# Patient Record
Sex: Female | Born: 1975
Health system: Southern US, Community
[De-identification: ages and names within clinical notes are randomized; demographics above are authoritative.]

## PROBLEM LIST (undated history)

## (undated) DIAGNOSIS — T7840XA Allergy, unspecified, initial encounter: Secondary | ICD-10-CM

## (undated) HISTORY — PX: WISDOM TOOTH EXTRACTION: SHX21

## (undated) HISTORY — PX: DILATION AND CURETTAGE OF UTERUS: SHX78

## (undated) HISTORY — PX: LIPOMA EXCISION: SHX5283

## (undated) HISTORY — DX: Allergy, unspecified, initial encounter: T78.40XA

---

## 2016-09-09 ENCOUNTER — Ambulatory Visit (INDEPENDENT_AMBULATORY_CARE_PROVIDER_SITE_OTHER): Payer: 59 | Admitting: Family Medicine

## 2016-09-09 ENCOUNTER — Encounter: Payer: Self-pay | Admitting: Family Medicine

## 2016-09-09 VITALS — BP 128/85 | HR 117 | Temp 101.3°F | Resp 12 | Ht 65.0 in | Wt 131.2 lb

## 2016-09-09 DIAGNOSIS — N911 Secondary amenorrhea: Secondary | ICD-10-CM

## 2016-09-09 DIAGNOSIS — J111 Influenza due to unidentified influenza virus with other respiratory manifestations: Secondary | ICD-10-CM

## 2016-09-09 LAB — POCT INFLUENZA A/B
Influenza A, POC: NEGATIVE
Influenza B, POC: NEGATIVE

## 2016-09-09 MED ORDER — OSELTAMIVIR PHOSPHATE 75 MG PO CAPS: 75.0000 mg | ORAL_CAPSULE | Freq: Two times a day (BID) | ORAL | 0 refills | Status: DC

## 2016-09-09 NOTE — Progress Notes (Signed)
Subjective:  Patient ID: Erika [name], female    DOB: 1975-09-17  Age: 41 y.o. MRN: [id number]  CC: Flu  HPI Erika [name] is a 41 y.o. female presents to the clinic today with concerns for influenza. She is also concerned about recent missed periods.  Patient reports that she's been sick since Sunday. She developed fever yesterday. She is currently febrile. She's had associated congestion, body aches, headache, cough. She's had a known exposure to the flu as her daughter was diagnosed. Her son has influenza as well. No known exacerbating or relieving factors. No other associated symptoms. She is very concerned that she has influenza.  Additionally, patient states that she has not had a period over the past 2 months. This is not normal for her as she is usually quite regular. Her husband has had a vasectomy and she took 2 pregnancy tests which were negative. No other reported symptoms. She would like to discuss this today.  PMH, Surgical Hx, Family Hx, Social History reviewed and updated as below.  No PMH per patient.  Past Surgical History:  Procedure Laterality Date  . DILATION AND CURETTAGE OF UTERUS    . WISDOM TOOTH EXTRACTION      Family History  Problem Relation Age of Onset  . Diabetes Maternal Aunt   . Ovarian cancer Maternal Grandmother   . Prostate cancer Maternal Grandfather     Social History  Substance Use Topics  . Smoking status: Never Smoker  . Smokeless tobacco: Never Used  . Alcohol use 0.0 - 0.6 oz/week   Review of Systems  Constitutional: Positive for fever.  HENT: Positive for congestion.   Respiratory: Positive for cough.   Genitourinary: Positive for menstrual problem.  Musculoskeletal:       Body aches.  Neurological: Positive for headaches.  All other systems reviewed and are negative.  Objective:   Today's Vitals: BP 128/85   Pulse (!) 117   Temp (!) 101.3 F (38.5 C) (Oral)   Resp 12   Ht 5\' 5"  (1.651 m)   Wt 131 lb 3.2 oz (59.5 kg)    SpO2 99%   BMI 21.83 kg/m   Physical Exam  Constitutional: She is oriented to person, place, and time. She appears well-developed. No distress.  HENT:  Head: Normocephalic and atraumatic.  Mouth/Throat: Oropharynx is clear and moist.  Normal TM's bilaterally.  Eyes: Conjunctivae are normal.  Neck: Neck supple.  Cardiovascular: Regular rhythm.   Tachycardic.  Pulmonary/Chest: Effort normal and breath sounds normal.  Abdominal: Soft. She exhibits no distension. There is no tenderness. There is no rebound and no guarding.  Musculoskeletal: Normal range of motion.  Lymphadenopathy:    She has no cervical adenopathy.  Neurological: She is alert and oriented to person, place, and time.  Skin: No rash noted.  Psychiatric: She has a normal mood and affect.  Vitals reviewed.  Assessment & Plan:   Problem List Items Addressed This Visit    Secondary amenorrhea    Amenorrhea 2 months. Negative pregnancy test at home and her husband has had a vasectomy. If persists over the next month will send to GYN for evaluation.      Influenza - Primary    New problem. Patient appears to have influenza despite negative test today. Treating with Tamiflu.      Relevant Medications   oseltamivir (TAMIFLU) 75 MG capsule   Other Relevant Orders   POCT Influenza A/B (Completed)     Outpatient Encounter Prescriptions as of 09/09/2016  Medication Sig  . oseltamivir (TAMIFLU) 75 MG capsule Take 1 capsule (75 mg total) by mouth 2 (two) times daily.   No facility-administered encounter medications on file as of 09/09/2016.     Follow-up: PRN  [place]

## 2016-09-09 NOTE — Assessment & Plan Note (Signed)
Amenorrhea 2 months. Negative pregnancy test at home and her husband has had a vasectomy. If persists over the next month will send to GYN for evaluation.

## 2016-09-09 NOTE — Progress Notes (Signed)
Pre visit review using our clinic review tool, if applicable. No additional management support is needed unless otherwise documented below in the visit note. 

## 2016-09-09 NOTE — Assessment & Plan Note (Signed)
New problem. Patient appears to have influenza despite negative test today. Treating with Tamiflu.

## 2016-09-09 NOTE — Patient Instructions (Signed)
If you don't have a cycle in the next month, I'll send you to GYN.  Tamiflu as prescribed.  Take care  Dr. Lacinda Axon

## 2016-09-29 ENCOUNTER — Ambulatory Visit: Payer: 59 | Admitting: Family Medicine

## 2016-12-08 ENCOUNTER — Encounter: Payer: Self-pay | Admitting: Family Medicine

## 2016-12-08 ENCOUNTER — Ambulatory Visit (INDEPENDENT_AMBULATORY_CARE_PROVIDER_SITE_OTHER): Payer: 59 | Admitting: Family Medicine

## 2016-12-08 DIAGNOSIS — H6121 Impacted cerumen, right ear: Secondary | ICD-10-CM

## 2016-12-08 DIAGNOSIS — H6120 Impacted cerumen, unspecified ear: Secondary | ICD-10-CM | POA: Insufficient documentation

## 2016-12-08 NOTE — Progress Notes (Signed)
Pre visit review using our clinic review tool, if applicable. No additional management support is needed unless otherwise documented below in the visit note. 

## 2016-12-08 NOTE — Assessment & Plan Note (Signed)
New problem. Irrigation performed today with resolution. Advised over-the-counter Debrox as needed to prevent recurrence.

## 2016-12-08 NOTE — Progress Notes (Signed)
   Subjective:  Patient ID: Erika [name], female    DOB: 1976/08/08  Age: 41 y.o. MRN: [id number]  CC: R ear clogged  HPI:  41 year old female presents with the above complaint.  Patient states that her symptoms started Friday. She's had difficulty hearing out of the right ear. It feels clogged. No associated fevers or chills. No pain. No other associated symptoms. No known exacerbating factors. She has used peroxide without resolution. No other complaints this time.  Social Hx   Social History   Social History  . Marital status: Married    Spouse name: N/A  . Number of children: N/A  . Years of education: N/A   Social History Main Topics  . Smoking status: Never Smoker  . Smokeless tobacco: Never Used  . Alcohol use 0.0 - 0.6 oz/week  . Drug use: No  . Sexual activity: Yes    Partners: Male   Other Topics Concern  . None   Social History Narrative  . None    Review of Systems  Constitutional: Negative.   HENT: Positive for hearing loss.    Objective:  BP 104/68   Pulse 72   Temp 98.2 F (36.8 C) (Oral)   Wt 131 lb 8 oz (59.6 kg)   SpO2 98%   BMI 21.88 kg/m   BP/Weight 12/08/2016 09/03/3157  Systolic BP 458 592  Diastolic BP 68 85  Wt. (Lbs) 131.5 131.2  BMI 21.88 21.83   Physical Exam  Constitutional: She is oriented to person, place, and time. She appears well-developed. No distress.  HENT:  Head: Normocephalic and atraumatic.  R TM obscured by Cerumen.  Pulmonary/Chest: Effort normal.  Neurological: She is alert and oriented to person, place, and time.  Psychiatric: She has a normal mood and affect.  Vitals reviewed.  Assessment & Plan:   Problem List Items Addressed This Visit    Cerumen impaction    New problem. Irrigation performed today with resolution. Advised over-the-counter Debrox as needed to prevent recurrence.         Follow-up: PRN  [place]

## 2016-12-10 DIAGNOSIS — Z1231 Encounter for screening mammogram for malignant neoplasm of breast: Secondary | ICD-10-CM | POA: Diagnosis not present

## 2016-12-10 LAB — HM MAMMOGRAPHY

## 2016-12-15 ENCOUNTER — Encounter: Payer: Self-pay | Admitting: Family Medicine

## 2017-01-01 ENCOUNTER — Telehealth: Payer: Self-pay | Admitting: *Deleted

## 2017-01-01 NOTE — Telephone Encounter (Signed)
Patient is currently out of the country with a cold and requested advise on what to take before boarding the plan to come back home. Pt contact [phone]

## 2017-01-01 NOTE — Telephone Encounter (Signed)
Flonase, antihistamine.

## 2017-01-01 NOTE — Telephone Encounter (Signed)
Patient in [place] , has allergies now has cold Sinus drainage and pressure and has nasal congestion , ear pressure with sore throat,  coughing productive cough,  taking sudafed.  Symptoms present for one week.   tried Mucinex D no help, saline spray, . Leaving to come home next Thursday.  Patient has friend that can bring OTC medications to her from [place].   Please advise.

## 2017-01-01 NOTE — Telephone Encounter (Signed)
Left voice mail to call back need more info regarding symptoms.

## 2017-01-01 NOTE — Telephone Encounter (Signed)
Patient advised of below , she states she can't take flonase due it gave her rash.  She asked if she could take claritin along with sudafed.  I advised her per Dr Derrel Nip ok to take together.

## 2017-01-01 NOTE — Telephone Encounter (Signed)
Pt called back returning your call. Thank you!  Call pt @ [phone]

## 2017-03-13 ENCOUNTER — Encounter: Payer: Self-pay | Admitting: Family Medicine

## 2017-03-13 ENCOUNTER — Ambulatory Visit (INDEPENDENT_AMBULATORY_CARE_PROVIDER_SITE_OTHER): Payer: 59 | Admitting: Family Medicine

## 2017-03-13 DIAGNOSIS — J988 Other specified respiratory disorders: Secondary | ICD-10-CM | POA: Diagnosis not present

## 2017-03-13 MED ORDER — DOXYCYCLINE HYCLATE 100 MG PO TABS: 100.0000 mg | ORAL_TABLET | Freq: Two times a day (BID) | ORAL | 0 refills | Status: DC

## 2017-03-13 NOTE — Progress Notes (Signed)
   Subjective:  Patient ID: Erika [name], female    DOB: Mar 19, 1976  Age: 41 y.o. MRN: [id number]  CC: Congestion, cough  HPI:  41 year old female presents with the above complaints.  Patient reports that she's been sick since May. It worsened when she went out of the country. She traveled to [place]. She actually saw a doctor while she was there and received some steroids. She reports that she had improvement. She subsequently had worsening of her symptoms again approximately 2 weeks ago. She's had severe congestion and cough. Cough is mildly productive. No fevers or chills. No shortness of breath. She's had no relief with Claritin. No other associated symptoms. No other complaints or concerns at this time.  Social Hx   Social History   Social History  . Marital status: Married    Spouse name: N/A  . Number of children: N/A  . Years of education: N/A   Social History Main Topics  . Smoking status: Never Smoker  . Smokeless tobacco: Never Used  . Alcohol use 0.0 - 0.6 oz/week  . Drug use: No  . Sexual activity: Yes    Partners: Male   Other Topics Concern  . None   Social History Narrative  . None    Review of Systems  Constitutional: Negative for fever.  HENT: Positive for congestion.   Respiratory: Positive for cough. Negative for shortness of breath.    Objective:  BP 106/80   Pulse 75   Temp 98.2 F (36.8 C) (Oral)   Wt 132 lb 6.4 oz (60.1 kg)   LMP 03/06/2017 (Approximate)   SpO2 99%   BMI 22.03 kg/m   BP/Weight 03/13/2017 12/08/2016 10/10/2444  Systolic BP 950 722 575  Diastolic BP 80 68 85  Wt. (Lbs) 132.4 131.5 131.2  BMI 22.03 21.88 21.83   Physical Exam  Constitutional: She is oriented to person, place, and time. She appears well-developed. No distress.  HENT:  Head: Normocephalic and atraumatic.  Oropharynx clear. Normal TMs bilaterally.  Eyes: Conjunctivae are normal. No scleral icterus.  Neck: Neck supple.  Cardiovascular: Normal rate and  regular rhythm.   No murmur heard. Pulmonary/Chest: Effort normal and breath sounds normal. She has no wheezes.  Lymphadenopathy:    She has cervical adenopathy.  Neurological: She is alert and oriented to person, place, and time.  Vitals reviewed.  Assessment & Plan:   Problem List Items Addressed This Visit      Respiratory   Respiratory infection    New problem. Given duration of illness and worsening symptoms, I am treating her with doxycycline.         Meds ordered this encounter  Medications  . doxycycline (VIBRA-TABS) 100 MG tablet    Sig: Take 1 tablet (100 mg total) by mouth 2 (two) times daily.    Dispense:  20 tablet    Refill:  0   Follow-up: PRN  [place]

## 2017-03-13 NOTE — Assessment & Plan Note (Signed)
New problem. Given duration of illness and worsening symptoms, I am treating her with doxycycline.

## 2017-03-13 NOTE — Patient Instructions (Signed)
Antibiotic as prescribed. Take with food. Be mindful of photosensitivity.  Continue Claritin.  Take care  Dr. Lacinda Axon

## 2017-04-10 ENCOUNTER — Telehealth: Payer: Self-pay | Admitting: Family Medicine

## 2017-04-10 ENCOUNTER — Other Ambulatory Visit: Payer: Self-pay | Admitting: Family Medicine

## 2017-04-10 MED ORDER — FLUCONAZOLE 150 MG PO TABS: ORAL_TABLET | ORAL | 3 refills | Status: DC

## 2017-04-10 NOTE — Telephone Encounter (Signed)
Reason for call: yeast infection Symptoms: itching  Vaginal discharge  Duration 3-4 days  Medications:no OTC, tried Monistat cream , previously on Doxycyline  Last seen for this problem: 03/13/17 just finished antibiotic last Saturday [place] to at beach  Seen by: Lacinda Axon

## 2017-04-10 NOTE — Telephone Encounter (Signed)
Pt called about possible having a yeast infection. Please advise?  Call pt @ [phone]. Thank you!

## 2017-04-10 NOTE — Telephone Encounter (Signed)
Rx sent 

## 2017-04-10 NOTE — Telephone Encounter (Signed)
Left voicemail advising script sent   

## 2017-07-13 ENCOUNTER — Ambulatory Visit (INDEPENDENT_AMBULATORY_CARE_PROVIDER_SITE_OTHER): Payer: 59

## 2017-07-13 DIAGNOSIS — Z23 Encounter for immunization: Secondary | ICD-10-CM

## 2017-11-13 ENCOUNTER — Telehealth: Payer: Self-pay

## 2017-11-13 NOTE — Telephone Encounter (Signed)
Copied from [place]. Topic: General - Call Back - No Documentation >> Nov 13, 2017 10:51 AM Arletha Grippe wrote: Reason for CRM: pt returning call - no crm  Cb is [phone]

## 2017-12-22 ENCOUNTER — Ambulatory Visit: Payer: 59 | Admitting: Internal Medicine

## 2017-12-22 ENCOUNTER — Other Ambulatory Visit (HOSPITAL_COMMUNITY)
Admission: RE | Admit: 2017-12-22 | Discharge: 2017-12-22 | Disposition: A | Discharge status: Home / Self Care | Payer: 59 | Source: Ambulatory Visit | Attending: Internal Medicine | Admitting: Internal Medicine

## 2017-12-22 ENCOUNTER — Encounter: Payer: Self-pay | Admitting: Internal Medicine

## 2017-12-22 VITALS — BP 102/64 | HR 95 | Temp 98.6°F | Ht 65.0 in | Wt 135.2 lb

## 2017-12-22 DIAGNOSIS — Z1389 Encounter for screening for other disorder: Secondary | ICD-10-CM

## 2017-12-22 DIAGNOSIS — Z1231 Encounter for screening mammogram for malignant neoplasm of breast: Secondary | ICD-10-CM

## 2017-12-22 DIAGNOSIS — Z124 Encounter for screening for malignant neoplasm of cervix: Secondary | ICD-10-CM | POA: Insufficient documentation

## 2017-12-22 DIAGNOSIS — Z1329 Encounter for screening for other suspected endocrine disorder: Secondary | ICD-10-CM | POA: Diagnosis not present

## 2017-12-22 DIAGNOSIS — Z0184 Encounter for antibody response examination: Secondary | ICD-10-CM | POA: Diagnosis not present

## 2017-12-22 DIAGNOSIS — Z1151 Encounter for screening for human papillomavirus (HPV): Secondary | ICD-10-CM | POA: Diagnosis not present

## 2017-12-22 DIAGNOSIS — Z1283 Encounter for screening for malignant neoplasm of skin: Secondary | ICD-10-CM | POA: Diagnosis not present

## 2017-12-22 DIAGNOSIS — N912 Amenorrhea, unspecified: Secondary | ICD-10-CM

## 2017-12-22 DIAGNOSIS — N926 Irregular menstruation, unspecified: Secondary | ICD-10-CM

## 2017-12-22 DIAGNOSIS — E559 Vitamin D deficiency, unspecified: Secondary | ICD-10-CM

## 2017-12-22 DIAGNOSIS — Z13220 Encounter for screening for lipoid disorders: Secondary | ICD-10-CM

## 2017-12-22 DIAGNOSIS — Z0000 Encounter for general adult medical examination without abnormal findings: Secondary | ICD-10-CM | POA: Diagnosis not present

## 2017-12-22 DIAGNOSIS — L509 Urticaria, unspecified: Secondary | ICD-10-CM

## 2017-12-22 DIAGNOSIS — Z1159 Encounter for screening for other viral diseases: Secondary | ICD-10-CM

## 2017-12-22 NOTE — Patient Instructions (Addendum)
Propylene glycol testing with dermatology or patch testing  Please schedule mammogram  Please schedule fasting labs  I referred you to dermatology Dr. Kellie Moor     Hives Hives (urticaria) are itchy, red, swollen areas on your skin. Hives can show up on any part of your body, and they can vary in size. They can be as small as the tip of a pen or much larger. Hives often fade within 24 hours (acute hives). In other cases, new hives show up after old ones fade. This can continue for many days or weeks (chronic hives). Hives are caused by your body's reaction to an irritant or to something that you are allergic to (trigger). You can get hives right after being around a trigger or hours later. Hives do not spread from person to person (are not contagious). Hives may get worse if you scratch them, if you exercise, or if you have worries (emotional stress). Follow these instructions at home: Medicines  Take or apply over-the-counter and prescription medicines only as told by your doctor.  If you were prescribed an antibiotic medicine, use it as told by your doctor. Do not stop taking the antibiotic even if you start to feel better. Skin Care  Apply cool, wet cloths (cool compresses) to the itchy, red, swollen areas.  Do not scratch your skin. Do not rub your skin. General instructions  Do not take hot showers or baths. This can make itching worse.  Do not wear tight clothes.  Use sunscreen and wear clothing that covers your skin when you are outside.  Avoid any triggers that cause your hives. Keep a journal to help you keep track of what causes your hives. Write down: ? What medicines you take. ? What you eat and drink. ? What products you use on your skin.  Keep all follow-up visits as told by your doctor. This is important. Contact a doctor if:  Your symptoms are not better with medicine.  Your joints are painful or swollen. Get help right away if:  You have a fever.  You have  belly pain.  Your tongue or lips are swollen.  Your eyelids are swollen.  Your chest or throat feels tight.  You have trouble breathing or swallowing. These symptoms may be an emergency. Do not wait to see if the symptoms will go away. Get medical help right away. Call your local emergency services (911 in the [place] Do not drive yourself to the hospital. This information is not intended to replace advice given to you by your health care provider. Make sure you discuss any questions you have with your health care provider. Document Released: 05/20/2008 Document Revised: 01/17/2016 Document Reviewed: 05/30/2015 Elsevier Interactive Patient Education  2018 [employer].

## 2017-12-22 NOTE — Progress Notes (Addendum)
Chief Complaint  Patient presents with  . Annual Exam   Annual exam  1. Hives with certain products including flonase regular formulations and sensimist formulation and also with certain cosmetic products. Will refer dermatology  2. Irregular cycles this time it has been 6 weeks. She is not pregnant b/c husband had a vasectomy. Last cycle 6 weeks ago and not had yet   Review of Systems  Constitutional: Negative for weight loss.  HENT: Negative for hearing loss.   Eyes: Negative for blurred vision.  Respiratory: Negative for shortness of breath.   Cardiovascular: Negative for chest pain.  Gastrointestinal: Negative for abdominal pain.  Genitourinary:       +irregular cycles   Musculoskeletal: Negative for joint pain.  Skin: Negative for rash.  Neurological: Negative for headaches.  Psychiatric/Behavioral: Negative for depression.   History reviewed. No pertinent past medical history. Past Surgical History:  Procedure Laterality Date  . DILATION AND CURETTAGE OF UTERUS    . WISDOM TOOTH EXTRACTION     Family History  Problem Relation Age of Onset  . Diabetes Maternal Aunt   . Ovarian cancer Maternal Grandmother   . Prostate cancer Maternal Grandfather    Social History   Socioeconomic History  . Marital status: Married    Spouse name: Not on file  . Number of children: Not on file  . Years of education: Not on file  . Highest education level: Not on file  Occupational History  . Not on file  Social Needs  . Financial resource strain: Not on file  . Food insecurity:    Worry: Not on file    Inability: Not on file  . Transportation needs:    Medical: Not on file    Non-medical: Not on file  Tobacco Use  . Smoking status: Never Smoker  . Smokeless tobacco: Never Used  Substance and Sexual Activity  . Alcohol use: Yes    Alcohol/week: 0.0 - 0.6 oz  . Drug use: No  . Sexual activity: Yes    Partners: Male  Lifestyle  . Physical activity:    Days per week: Not  on file    Minutes per session: Not on file  . Stress: Not on file  Relationships  . Social connections:    Talks on phone: Not on file    Gets together: Not on file    Attends religious service: Not on file    Active member of club or organization: Not on file    Attends meetings of clubs or organizations: Not on file    Relationship status: Not on file  . Intimate partner violence:    Fear of current or ex partner: Not on file    Emotionally abused: Not on file    Physically abused: Not on file    Forced sexual activity: Not on file  Other Topics Concern  . Not on file  Social History Narrative  . Not on file   No outpatient medications have been marked as taking for the 12/22/17 encounter (Office Visit) with McLean-Scocuzza, Nino Glow, MD.   Allergies  Allergen Reactions  . Flonase [Fluticasone] Rash  . Penicillins Other (See Comments)   No results found for this or any previous visit (from the past 2160 hour(s)). Objective  Body mass index is 22.5 kg/m. Wt Readings from Last 3 Encounters:  12/22/17 135 lb 3.2 oz (61.3 kg)  03/13/17 132 lb 6.4 oz (60.1 kg)  12/08/16 131 lb 8 oz (59.6 kg)  Temp Readings from Last 3 Encounters:  12/22/17 98.6 F (37 C) (Oral)  03/13/17 98.2 F (36.8 C) (Oral)  12/08/16 98.2 F (36.8 C) (Oral)   BP Readings from Last 3 Encounters:  12/22/17 102/64  03/13/17 106/80  12/08/16 104/68   Pulse Readings from Last 3 Encounters:  12/22/17 95  03/13/17 75  12/08/16 72    Physical Exam  Constitutional: She is oriented to person, place, and time. Vital signs are normal. She appears well-developed and well-nourished. She is cooperative.  HENT:  Head: Normocephalic and atraumatic.  Mouth/Throat: Oropharynx is clear and moist and mucous membranes are normal.  Eyes: Pupils are equal, round, and reactive to light. Conjunctivae are normal.  Cardiovascular: Normal rate, regular rhythm and normal heart sounds.  Pulmonary/Chest: Effort normal  and breath sounds normal. Right breast exhibits no inverted nipple, no mass, no nipple discharge, no skin change and no tenderness. Left breast exhibits no inverted nipple, no mass, no nipple discharge, no skin change and no tenderness. No breast swelling, tenderness, discharge or bleeding. Breasts are symmetrical.  Abdominal: Soft. Bowel sounds are normal.  Genitourinary: Vagina normal and uterus normal. Pelvic exam was performed with patient supine. There is no lesion on the right labia. There is no lesion on the left labia. Cervix exhibits discharge and friability. Cervix exhibits no motion tenderness. Right adnexum displays no mass, no tenderness and no fullness. Left adnexum displays no mass, no tenderness and no fullness.  Neurological: She is alert and oriented to person, place, and time. Gait normal.  Skin: Skin is warm, dry and intact.  Psychiatric: She has a normal mood and affect. Her speech is normal and behavior is normal. Judgment and thought content normal. Cognition and memory are normal.  Nursing note and vitals reviewed. mild thin clear discharge from cervix   Assessment   1. Annual exam  2. Irregular cycles ? If perimenopausal  Plan  1. Breast exam and pap today  Referred for mammogram [facility] done 02/04/18 negative  Referred dermatology skin check tbse and patch test propylene glycol vs other Had flu shot  Tdap had 10/11/10   Continue exercise and healthy diet choices  Annual labs pt to return fasting   2.  Check [facility]  Provider: Dr. Olivia Mackie McLean-Scocuzza-Internal Medicine

## 2017-12-22 NOTE — Progress Notes (Signed)
Pre visit review using our clinic review tool, if applicable. No additional management support is needed unless otherwise documented below in the visit note. 

## 2017-12-23 LAB — URINALYSIS, ROUTINE W REFLEX MICROSCOPIC
Bacteria, UA: NONE SEEN /HPF
Bilirubin Urine: NEGATIVE
Glucose, UA: NEGATIVE
HGB URINE DIPSTICK: NEGATIVE
Hyaline Cast: NONE SEEN /LPF
Nitrite: NEGATIVE
PH: 7 (ref 5.0–8.0)
Protein, ur: NEGATIVE
RBC / HPF: NONE SEEN /HPF (ref 0–2)
Specific Gravity, Urine: 1.008 (ref 1.001–1.03)
Squamous Epithelial / LPF: NONE SEEN /HPF (ref <=5)

## 2017-12-25 LAB — CYTOLOGY - PAP
Bacterial vaginitis: NEGATIVE
CANDIDA VAGINITIS: NEGATIVE
Diagnosis: NEGATIVE
HPV (WINDOPATH): NOT DETECTED

## 2018-01-01 ENCOUNTER — Other Ambulatory Visit (INDEPENDENT_AMBULATORY_CARE_PROVIDER_SITE_OTHER): Payer: 59

## 2018-01-01 DIAGNOSIS — N926 Irregular menstruation, unspecified: Secondary | ICD-10-CM | POA: Diagnosis not present

## 2018-01-01 DIAGNOSIS — E559 Vitamin D deficiency, unspecified: Secondary | ICD-10-CM | POA: Diagnosis not present

## 2018-01-01 DIAGNOSIS — Z13220 Encounter for screening for lipoid disorders: Secondary | ICD-10-CM

## 2018-01-01 DIAGNOSIS — Z1329 Encounter for screening for other suspected endocrine disorder: Secondary | ICD-10-CM

## 2018-01-01 DIAGNOSIS — Z0184 Encounter for antibody response examination: Secondary | ICD-10-CM

## 2018-01-01 DIAGNOSIS — Z0000 Encounter for general adult medical examination without abnormal findings: Secondary | ICD-10-CM

## 2018-01-01 DIAGNOSIS — N912 Amenorrhea, unspecified: Secondary | ICD-10-CM | POA: Diagnosis not present

## 2018-01-01 DIAGNOSIS — Z1159 Encounter for screening for other viral diseases: Secondary | ICD-10-CM

## 2018-01-01 NOTE — Addendum Note (Signed)
Addended by: Arby Barrette on: 01/01/2018 09:39 AM   Modules accepted: Orders

## 2018-01-02 LAB — COMPREHENSIVE METABOLIC PANEL
AG Ratio: 1.6 (calc) (ref 1.0–2.5)
ALKALINE PHOSPHATASE (APISO): 56 U/L (ref 33–115)
ALT: 16 U/L (ref 6–29)
AST: 22 U/L (ref 10–30)
Albumin: 4.2 g/dL (ref 3.6–5.1)
BILIRUBIN TOTAL: 0.7 mg/dL (ref 0.2–1.2)
BUN: 14 mg/dL (ref 7–25)
CALCIUM: 9.2 mg/dL (ref 8.6–10.2)
CO2: 24 mmol/L (ref 20–32)
Chloride: 105 mmol/L (ref 98–110)
Creat: 0.75 mg/dL (ref 0.50–1.10)
Globulin: 2.7 g/dL (calc) (ref 1.9–3.7)
Glucose, Bld: 77 mg/dL (ref 65–99)
Potassium: 4.7 mmol/L (ref 3.5–5.3)
SODIUM: 139 mmol/L (ref 135–146)
TOTAL PROTEIN: 6.9 g/dL (ref 6.1–8.1)

## 2018-01-02 LAB — FOLLICLE STIMULATING HORMONE: FSH: 32.2 m[IU]/mL

## 2018-01-02 LAB — LIPID PANEL
CHOLESTEROL: 172 mg/dL (ref <200)
HDL: 61 mg/dL (ref >50)
LDL CHOLESTEROL (CALC): 93 mg/dL
Non-HDL Cholesterol (Calc): 111 mg/dL (calc) (ref <130)
TRIGLYCERIDES: 88 mg/dL (ref <150)
Total CHOL/HDL Ratio: 2.8 (calc) (ref <5.0)

## 2018-01-02 LAB — CBC WITH DIFFERENTIAL/PLATELET
BASOS PCT: 0.8 %
Basophils Absolute: 50 cells/uL (ref 0–200)
EOS PCT: 1.7 %
Eosinophils Absolute: 107 cells/uL (ref 15–500)
HEMATOCRIT: 38 % (ref 35.0–45.0)
HEMOGLOBIN: 12.6 g/dL (ref 11.7–15.5)
LYMPHS ABS: 2136 {cells}/uL (ref 850–3900)
MCH: 28.6 pg (ref 27.0–33.0)
MCHC: 33.2 g/dL (ref 32.0–36.0)
MCV: 86.2 fL (ref 80.0–100.0)
MPV: 10.8 fL (ref 7.5–12.5)
Monocytes Relative: 9.5 %
Neutro Abs: 3408 cells/uL (ref 1500–7800)
Neutrophils Relative %: 54.1 %
Platelets: 342 10*3/uL (ref 140–400)
RBC: 4.41 10*6/uL (ref 3.80–5.10)
RDW: 13.9 % (ref 11.0–15.0)
Total Lymphocyte: 33.9 %
WBC mixed population: 599 cells/uL (ref 200–950)
WBC: 6.3 10*3/uL (ref 3.8–10.8)

## 2018-01-02 LAB — VITAMIN D 25 HYDROXY (VIT D DEFICIENCY, FRACTURES): Vit D, 25-Hydroxy: 30 ng/mL (ref 30–100)

## 2018-01-02 LAB — T4, FREE: FREE T4: 1 ng/dL (ref 0.8–1.8)

## 2018-01-02 LAB — HEPATITIS B SURFACE ANTIBODY, QUANTITATIVE: Hepatitis B-Post: 757 m[IU]/mL (ref >=10)

## 2018-01-02 LAB — TSH: TSH: 1.34 mIU/L

## 2018-02-04 DIAGNOSIS — Z1231 Encounter for screening mammogram for malignant neoplasm of breast: Secondary | ICD-10-CM | POA: Diagnosis not present

## 2018-02-04 LAB — HM MAMMOGRAPHY

## 2018-02-21 ENCOUNTER — Encounter: Payer: Self-pay | Admitting: Internal Medicine

## 2018-04-08 DIAGNOSIS — D225 Melanocytic nevi of trunk: Secondary | ICD-10-CM | POA: Diagnosis not present

## 2018-04-08 DIAGNOSIS — L7211 Pilar cyst: Secondary | ICD-10-CM | POA: Diagnosis not present

## 2018-04-08 DIAGNOSIS — D2261 Melanocytic nevi of right upper limb, including shoulder: Secondary | ICD-10-CM | POA: Diagnosis not present

## 2018-06-06 DIAGNOSIS — Z23 Encounter for immunization: Secondary | ICD-10-CM | POA: Diagnosis not present

## 2018-06-24 ENCOUNTER — Encounter: Payer: Self-pay | Admitting: Internal Medicine

## 2018-06-24 ENCOUNTER — Ambulatory Visit: Payer: 59 | Admitting: Internal Medicine

## 2018-06-24 VITALS — BP 90/56 | HR 85 | Temp 98.3°F | Ht 65.0 in | Wt 136.0 lb

## 2018-06-24 DIAGNOSIS — Z780 Asymptomatic menopausal state: Secondary | ICD-10-CM | POA: Diagnosis not present

## 2018-06-24 DIAGNOSIS — N951 Menopausal and female climacteric states: Secondary | ICD-10-CM

## 2018-06-24 DIAGNOSIS — L739 Follicular disorder, unspecified: Secondary | ICD-10-CM

## 2018-06-24 DIAGNOSIS — G4700 Insomnia, unspecified: Secondary | ICD-10-CM | POA: Diagnosis not present

## 2018-06-24 MED ORDER — VENLAFAXINE HCL ER 37.5 MG PO CP24: 37.5000 mg | ORAL_CAPSULE | Freq: Every day | ORAL | 2 refills | Status: DC

## 2018-06-24 NOTE — Progress Notes (Signed)
Chief Complaint  Patient presents with  . Follow-up   F/u  1. C/o irregular menses last may then oct, mood swings more angry easily. She denies hot flashes but is having insomnia with weight fluctuations denies vaginal dryness. She is in counseling with husband for libido issues which have been ongoing prior to theses changes [place] was >30. Her mother experienced menopause early in 28s  2. C/o inner thighs she is shaving at times gets hair bumps appear ingrown and wants to know what to do   Review of Systems  Constitutional: Negative for weight loss.  HENT: Negative for hearing loss.   Eyes: Negative for blurred vision.  Respiratory: Negative for shortness of breath.   Cardiovascular: Negative for chest pain.  Genitourinary:       +irregular menses   Psychiatric/Behavioral: The patient has insomnia.        +mood swings    Past Medical History:  Diagnosis Date  . Allergy    Past Surgical History:  Procedure Laterality Date  . DILATION AND CURETTAGE OF UTERUS    . WISDOM TOOTH EXTRACTION     Family History  Problem Relation Age of Onset  . Diabetes Maternal Aunt   . Ovarian cancer Maternal Grandmother   . Prostate cancer Maternal Grandfather    Social History   Socioeconomic History  . Marital status: Married    Spouse name: Not on file  . Number of children: Not on file  . Years of education: Not on file  . Highest education level: Not on file  Occupational History  . Not on file  Social Needs  . Financial resource strain: Not on file  . Food insecurity:    Worry: Not on file    Inability: Not on file  . Transportation needs:    Medical: Not on file    Non-medical: Not on file  Tobacco Use  . Smoking status: Never Smoker  . Smokeless tobacco: Never Used  Substance and Sexual Activity  . Alcohol use: Yes    Alcohol/week: 0.0 - 1.0 standard drinks  . Drug use: No  . Sexual activity: Yes    Partners: Male  Lifestyle  . Physical activity:    Days per week: Not  on file    Minutes per session: Not on file  . Stress: Not on file  Relationships  . Social connections:    Talks on phone: Not on file    Gets together: Not on file    Attends religious service: Not on file    Active member of club or organization: Not on file    Attends meetings of clubs or organizations: Not on file    Relationship status: Not on file  . Intimate partner violence:    Fear of current or ex partner: Not on file    Emotionally abused: Not on file    Physically abused: Not on file    Forced sexual activity: Not on file  Other Topics Concern  . Not on file  Social History Narrative   Works Ship broker in [place]    Married 2 kids boy 78 y.o and girl 96 y.o 12/22/17    No outpatient medications have been marked as taking for the 06/24/18 encounter (Office Visit) with McLean-Scocuzza, Nino Glow, MD.   Allergies  Allergen Reactions  . Flonase [Fluticasone] Rash  . Penicillins Other (See Comments)   No results found for this or any previous visit (from the past 2160 hour(s)). Objective  Body mass  index is 22.63 kg/m. Wt Readings from Last 3 Encounters:  06/24/18 136 lb (61.7 kg)  12/22/17 135 lb 3.2 oz (61.3 kg)  03/13/17 132 lb 6.4 oz (60.1 kg)   Temp Readings from Last 3 Encounters:  06/24/18 98.3 F (36.8 C) (Oral)  12/22/17 98.6 F (37 C) (Oral)  03/13/17 98.2 F (36.8 C) (Oral)   BP Readings from Last 3 Encounters:  06/24/18 (!) 90/56  12/22/17 102/64  03/13/17 106/80   Pulse Readings from Last 3 Encounters:  06/24/18 85  12/22/17 95  03/13/17 75    Physical Exam  Constitutional: She is oriented to person, place, and time. Vital signs are normal. She appears well-developed and well-nourished. She is cooperative.  HENT:  Head: Normocephalic and atraumatic.  Mouth/Throat: Oropharynx is clear and moist and mucous membranes are normal.  Eyes: Pupils are equal, round, and reactive to light. Conjunctivae are normal.  Cardiovascular: Normal rate,  regular rhythm and normal heart sounds.  Pulmonary/Chest: Effort normal and breath sounds normal.  Neurological: She is alert and oriented to person, place, and time. Gait normal.  Skin: Skin is warm, dry and intact.  Psychiatric: She has a normal mood and affect. Her speech is normal and behavior is normal. Judgment and thought content normal. Cognition and memory are normal.  Nursing note and vitals reviewed.   Assessment   1. Likely perimenopause with mood irritability and insomnia see HPI 2. HM 3. Folliculitis  Plan   1.  Trial of effexor 37.5 mg qd  Disc melatonin or L theanine  My chart in 1 month to see how doing 2.  Pap 12/22/17 neg pap neg HPV  02/04/18 mammogram [facility] [place] done 02/04/18 negative  Referred dermatology skin check tbse and patch test propylene glycol vs other saw 2019 negative  Had flu shot 10/13/1 9 Tdap had 10/11/10   Continue exercise and healthy diet choices  3.  Disc otc cetaphil antibacterial soap if this does not work try clindamycin topical  Disc Copy   Provider: Dr. Olivia Mackie McLean-Scocuzza-[facility]

## 2018-06-24 NOTE — Progress Notes (Signed)
Pre visit review using our clinic review tool, if applicable. No additional management support is needed unless otherwise documented below in the visit note. 

## 2018-06-24 NOTE — Patient Instructions (Addendum)
Melatonin or L theanine -[employer] or [employer]  Diffuser with lavender  My chart in 1 month and let me know you are doing   cetaphil antibacterial soap  Electric razor    Folliculitis Folliculitis is inflammation of the hair follicles. Folliculitis most commonly occurs on the scalp, thighs, legs, back, and buttocks. However, it can occur anywhere on the body. What are the causes? This condition may be caused by:  A bacterial infection (common).  A fungal infection.  A viral infection.  Coming into contact with certain chemicals, especially oils and tars.  Shaving or waxing.  Applying greasy ointments or creams to your skin often.  Long-lasting folliculitis and folliculitis that keeps coming back can be caused by bacteria that live in the nostrils. What increases the risk? This condition is more likely to develop in people with:  A weakened immune system.  Diabetes.  Obesity.  What are the signs or symptoms? Symptoms of this condition include:  Redness.  Soreness.  Swelling.  Itching.  Small white or yellow, pus-filled, itchy spots (pustules) that appear over a reddened area. If there is an infection that goes deep into the follicle, these may develop into a boil (furuncle).  A group of closely packed boils (carbuncle). These tend to form in hairy, sweaty areas of the body.  How is this diagnosed? This condition is diagnosed with a skin exam. To find what is causing the condition, your health care provider may take a sample of one of the pustules or boils for testing. How is this treated? This condition may be treated by:  Applying warm compresses to the affected areas.  Taking an antibiotic medicine or applying an antibiotic medicine to the skin.  Applying or bathing with an antiseptic solution.  Taking an over-the-counter medicine to help with itching.  Having a procedure to drain any pustules or boils. This may be  done if a pustule or boil contains a lot of pus or fluid.  Laser hair removal. This may be done to treat long-lasting folliculitis.  Follow these instructions at home:  If directed, apply heat to the affected area as often as told by your health care provider. Use the heat source that your health care provider recommends, such as a moist heat pack or a heating pad. ? Place a towel between your skin and the heat source. ? Leave the heat on for 20-30 minutes. ? Remove the heat if your skin turns bright red. This is especially important if you are unable to feel pain, heat, or cold. You may have a greater risk of getting burned.  If you were prescribed an antibiotic medicine, use it as told by your health care provider. Do not stop using the antibiotic even if you start to feel better.  Take over-the-counter and prescription medicines only as told by your health care provider.  Do not shave irritated skin.  Keep all follow-up visits as told by your health care provider. This is important. Get help right away if:  You have more redness, swelling, or pain in the affected area.  Red streaks are spreading from the affected area.  You have a fever. This information is not intended to replace advice given to you by your health care provider. Make sure you discuss any questions you have with your health care provider. Document Released: 10/20/2001 Document Revised: 02/29/2016 Document Reviewed: 06/01/2015 Elsevier Interactive Patient Education  2018 [employer].  Menopause Menopause is the normal time  of life when menstrual periods stop completely. Menopause is complete when you have missed 12 consecutive menstrual periods. It usually occurs between the ages of 101 years and 22 years. Very rarely does a woman develop menopause before the age of 17 years. At menopause, your ovaries stop producing the female hormones estrogen and progesterone. This can cause undesirable symptoms and also affect  your health. Sometimes the symptoms may occur 4-5 years before the menopause begins. There is no relationship between menopause and:  Oral contraceptives.  Number of children you had.  Race.  The age your menstrual periods started (menarche).  Heavy smokers and very thin women may develop menopause earlier in life. What are the causes?  The ovaries stop producing the female hormones estrogen and progesterone. Other causes include:  Surgery to remove both ovaries.  The ovaries stop functioning for no known reason.  Tumors of the pituitary gland in the brain.  Medical disease that affects the ovaries and hormone production.  Radiation treatment to the abdomen or pelvis.  Chemotherapy that affects the ovaries.  What are the signs or symptoms?  Hot flashes.  Night sweats.  Decrease in sex drive.  Vaginal dryness and thinning of the vagina causing painful intercourse.  Dryness of the skin and developing wrinkles.  Headaches.  Tiredness.  Irritability.  Memory problems.  Weight gain.  Bladder infections.  Hair growth of the face and chest.  Infertility. More serious symptoms include:  Loss of bone (osteoporosis) causing breaks (fractures).  Depression.  Hardening and narrowing of the arteries (atherosclerosis) causing heart attacks and strokes.  How is this diagnosed?  When the menstrual periods have stopped for 12 straight months.  Physical exam.  Hormone studies of the blood. How is this treated? There are many treatment choices and nearly as many questions about them. The decisions to treat or not to treat menopausal changes is an individual choice made with your health care provider. Your health care provider can discuss the treatments with you. Together, you can decide which treatment will work best for you. Your treatment choices may include:  Hormone therapy (estrogen and progesterone).  Non-hormonal medicines.  Treating the individual  symptoms with medicine (for example antidepressants for depression).  Herbal medicines that may help specific symptoms.  Counseling by a psychiatrist or psychologist.  Group therapy.  Lifestyle changes including: ? Eating healthy. ? Regular exercise. ? Limiting caffeine and alcohol. ? Stress management and meditation.  No treatment.  Follow these instructions at home:  Take the medicine your health care provider gives you as directed.  Get plenty of sleep and rest.  Exercise regularly.  Eat a diet that contains calcium (good for the bones) and soy products (acts like estrogen hormone).  Avoid alcoholic beverages.  Do not smoke.  If you have hot flashes, dress in layers.  Take supplements, calcium, and vitamin D to strengthen bones.  You can use over-the-counter lubricants or moisturizers for vaginal dryness.  Group therapy is sometimes very helpful.  Acupuncture may be helpful in some cases. Contact a health care provider if:  You are not sure you are in menopause.  You are having menopausal symptoms and need advice and treatment.  You are still having menstrual periods after age 13 years.  You have pain with intercourse.  Menopause is complete (no menstrual period for 12 months) and you develop vaginal bleeding.  You need a referral to a specialist (gynecologist, psychiatrist, or psychologist) for treatment. Get help right away if:  You have  severe depression.  You have excessive vaginal bleeding.  You fell and think you have a broken bone.  You have pain when you urinate.  You develop leg or chest pain.  You have a fast pounding heart beat (palpitations).  You have severe headaches.  You develop vision problems.  You feel a lump in your breast.  You have abdominal pain or severe indigestion. This information is not intended to replace advice given to you by your health care provider. Make sure you discuss any questions you have with your  health care provider. Document Released: 11/01/2003 Document Revised: 01/17/2016 Document Reviewed: 03/10/2013 Elsevier Interactive Patient Education  2017 [employer].   Venlafaxine extended-release capsules What is this medicine? VENLAFAXINE(VEN la fax een) is used to treat depression, anxiety and panic disorder. This medicine may be used for other purposes; ask your health care provider or pharmacist if you have questions. COMMON BRAND NAME(S): Effexor XR What should I tell my health care provider before I take this medicine? They need to know if you have any of these conditions: -bleeding disorders -glaucoma -heart disease -high blood pressure -high cholesterol -kidney disease -liver disease -low levels of sodium in the blood -mania or bipolar disorder -seizures -suicidal thoughts, plans, or attempt; a previous suicide attempt by you or a family -take medicines that treat or prevent blood clots -thyroid disease -an unusual or allergic reaction to venlafaxine, desvenlafaxine, other medicines, foods, dyes, or preservatives -pregnant or trying to get pregnant -breast-feeding How should I use this medicine? Take this medicine by mouth with a full glass of water. Follow the directions on the prescription label. Do not cut, crush, or chew this medicine. Take it with food. If needed, the capsule may be carefully opened and the entire contents sprinkled on a spoonful of cool applesauce. Swallow the applesauce/pellet mixture right away without chewing and follow with a glass of water to ensure complete swallowing of the pellets. Try to take your medicine at about the same time each day. Do not take your medicine more often than directed. Do not stop taking this medicine suddenly except upon the advice of your doctor. Stopping this medicine too quickly may cause serious side effects or your condition may worsen. A special MedGuide will be given to you by the pharmacist with each  prescription and refill. Be sure to read this information carefully each time. Talk to your pediatrician regarding the use of this medicine in children. Special care may be needed. Overdosage: If you think you have taken too much of this medicine contact a poison control center or emergency room at once. NOTE: This medicine is only for you. Do not share this medicine with others. What if I miss a dose? If you miss a dose, take it as soon as you can. If it is almost time for your next dose, take only that dose. Do not take double or extra doses. What may interact with this medicine? Do not take this medicine with any of the following medications: -certain medicines for fungal infections like fluconazole, itraconazole, ketoconazole, posaconazole, voriconazole -cisapride -desvenlafaxine -dofetilide -dronedarone -duloxetine -levomilnacipran -linezolid -MAOIs like Carbex, Eldepryl, Marplan, Nardil, and Parnate -methylene blue (injected into a vein) -milnacipran -pimozide -thioridazine -ziprasidone This medicine may also interact with the following medications: -amphetamines -aspirin and aspirin-like medicines -certain medicines for depression, anxiety, or psychotic disturbances -certain medicines for migraine headaches like almotriptan, eletriptan, frovatriptan, naratriptan, rizatriptan, sumatriptan, zolmitriptan -certain medicines for sleep -certain medicines that treat or prevent blood clots like  dalteparin, enoxaparin, warfarin -cimetidine -clozapine -diuretics -fentanyl -furazolidone -indinavir -isoniazid -lithium -metoprolol -NSAIDS, medicines for pain and inflammation, like ibuprofen or naproxen -other medicines that prolong the QT interval (cause an abnormal heart rhythm) -procarbazine -rasagiline -supplements like St. John's wort, kava kava, valerian -tramadol -tryptophan This list may not describe all possible interactions. Give your health care provider a list of all  the medicines, herbs, non-prescription drugs, or dietary supplements you use. Also tell them if you smoke, drink alcohol, or use illegal drugs. Some items may interact with your medicine. What should I watch for while using this medicine? Tell your doctor if your symptoms do not get better or if they get worse. Visit your doctor or health care professional for regular checks on your progress. Because it may take several weeks to see the full effects of this medicine, it is important to continue your treatment as prescribed by your doctor. Patients and their families should watch out for new or worsening thoughts of suicide or depression. Also watch out for sudden changes in feelings such as feeling anxious, agitated, panicky, irritable, hostile, aggressive, impulsive, severely restless, overly excited and hyperactive, or not being able to sleep. If this happens, especially at the beginning of treatment or after a change in dose, call your health care professional. This medicine can cause an increase in blood pressure. Check with your doctor for instructions on monitoring your blood pressure while taking this medicine. You may get drowsy or dizzy. Do not drive, use machinery, or do anything that needs mental alertness until you know how this medicine affects you. Do not stand or sit up quickly, especially if you are an older patient. This reduces the risk of dizzy or fainting spells. Alcohol may interfere with the effect of this medicine. Avoid alcoholic drinks. Your mouth may get dry. Chewing sugarless gum, sucking hard candy and drinking plenty of water will help. Contact your doctor if the problem does not go away or is severe. What side effects may I notice from receiving this medicine? Side effects that you should report to your doctor or health care professional as soon as possible: -allergic reactions like skin rash, itching or hives, swelling of the face, lips, or tongue -anxious -breathing  problems -confusion -changes in vision -chest pain -confusion -elevated mood, decreased need for sleep, racing thoughts, impulsive behavior -eye pain -fast, irregular heartbeat -feeling faint or lightheaded, falls -feeling agitated, angry, or irritable -hallucination, loss of contact with reality -high blood pressure -loss of balance or coordination -palpitations -redness, blistering, peeling or loosening of the skin, including inside the mouth -restlessness, pacing, inability to keep still -seizures -stiff muscles -suicidal thoughts or other mood changes -trouble passing urine or change in the amount of urine -trouble sleeping -unusual bleeding or bruising -unusually weak or tired -vomiting Side effects that usually do not require medical attention (report to your doctor or health care professional if they continue or are bothersome): -change in sex drive or performance -change in appetite or weight -constipation -dizziness -dry mouth -headache -increased sweating -nausea -tired This list may not describe all possible side effects. Call your doctor for medical advice about side effects. You may report side effects to FDA at 1-800-FDA-1088. Where should I keep my medicine? Keep out of the reach of children. Store at a controlled temperature between 20 and 25 degrees C (68 degrees and 77 degrees F), in a dry place. Throw away any unused medicine after the expiration date. NOTE: This sheet is a summary. It may  not cover all possible information. If you have questions about this medicine, talk to your doctor, pharmacist, or health care provider.  2018 Elsevier/Gold Standard (2016-01-10 18:38:02)

## 2018-12-24 ENCOUNTER — Encounter: Payer: 59 | Admitting: Internal Medicine

## 2019-01-20 ENCOUNTER — Encounter: Payer: 59 | Admitting: Internal Medicine

## 2019-01-28 ENCOUNTER — Other Ambulatory Visit: Payer: Self-pay

## 2019-02-01 ENCOUNTER — Encounter: Payer: Self-pay | Admitting: Internal Medicine

## 2019-02-01 ENCOUNTER — Other Ambulatory Visit: Payer: Self-pay

## 2019-02-01 ENCOUNTER — Ambulatory Visit (INDEPENDENT_AMBULATORY_CARE_PROVIDER_SITE_OTHER): Payer: 59 | Admitting: Internal Medicine

## 2019-02-01 VITALS — BP 134/82 | HR 67 | Temp 98.4°F | Ht 65.0 in | Wt 134.8 lb

## 2019-02-01 DIAGNOSIS — L509 Urticaria, unspecified: Secondary | ICD-10-CM

## 2019-02-01 DIAGNOSIS — Z1389 Encounter for screening for other disorder: Secondary | ICD-10-CM

## 2019-02-01 DIAGNOSIS — Z1329 Encounter for screening for other suspected endocrine disorder: Secondary | ICD-10-CM | POA: Diagnosis not present

## 2019-02-01 DIAGNOSIS — N951 Menopausal and female climacteric states: Secondary | ICD-10-CM

## 2019-02-01 DIAGNOSIS — Z13220 Encounter for screening for lipoid disorders: Secondary | ICD-10-CM

## 2019-02-01 DIAGNOSIS — Z0000 Encounter for general adult medical examination without abnormal findings: Secondary | ICD-10-CM

## 2019-02-01 DIAGNOSIS — E559 Vitamin D deficiency, unspecified: Secondary | ICD-10-CM

## 2019-02-01 DIAGNOSIS — Z1231 Encounter for screening mammogram for malignant neoplasm of breast: Secondary | ICD-10-CM

## 2019-02-01 HISTORY — DX: Menopausal and female climacteric states: N95.1

## 2019-02-01 HISTORY — DX: Encounter for general adult medical examination without abnormal findings: Z00.00

## 2019-02-01 HISTORY — DX: Urticaria, unspecified: L50.9

## 2019-02-01 NOTE — Patient Instructions (Addendum)
Dr. Orvil Feil allergist  [place] [phone]     Menopause Menopause is the normal time of life when menstrual periods stop completely. It is usually confirmed by 12 months without a menstrual period. The transition to menopause (perimenopause) most often happens between the ages of 54 and 37. During perimenopause, hormone levels change in your body, which can cause symptoms and affect your health. Menopause may increase your risk for:  Loss of bone (osteoporosis), which causes bone breaks (fractures).  Depression.  Hardening and narrowing of the arteries (atherosclerosis), which can cause heart attacks and strokes. What are the causes? This condition is usually caused by a natural change in hormone levels that happens as you get older. The condition may also be caused by surgery to remove both ovaries (bilateral oophorectomy). What increases the risk? This condition is more likely to start at an earlier age if you have certain medical conditions or treatments, including:  A tumor of the pituitary gland in the brain.  A disease that affects the ovaries and hormone production.  Radiation treatment for cancer.  Certain cancer treatments, such as chemotherapy or hormone (anti-estrogen) therapy.  Heavy smoking and excessive alcohol use.  Family history of early menopause. This condition is also more likely to develop earlier in women who are very thin. What are the signs or symptoms? Symptoms of this condition include:  Hot flashes.  Irregular menstrual periods.  Night sweats.  Changes in feelings about sex. This could be a decrease in sex drive or an increased comfort around your sexuality.  Vaginal dryness and thinning of the vaginal walls. This may cause painful intercourse.  Dryness of the skin and development of wrinkles.  Headaches.  Problems sleeping (insomnia).  Mood swings or irritability.  Memory problems.  Weight gain.  Hair growth on the face  and chest.  Bladder infections or problems with urinating. How is this diagnosed? This condition is diagnosed based on your medical history, a physical exam, your age, your menstrual history, and your symptoms. Hormone tests may also be done. How is this treated? In some cases, no treatment is needed. You and your health care provider should make a decision together about whether treatment is necessary. Treatment will be based on your individual condition and preferences. Treatment for this condition focuses on managing symptoms. Treatment may include:  Menopausal hormone therapy (MHT).  Medicines to treat specific symptoms or complications.  Acupuncture.  Vitamin or herbal supplements. Before starting treatment, make sure to let your health care provider know if you have a personal or family history of:  Heart disease.  Breast cancer.  Blood clots.  Diabetes.  Osteoporosis. Follow these instructions at home: Lifestyle  Do not use any products that contain nicotine or tobacco, such as cigarettes and e-cigarettes. If you need help quitting, ask your health care provider.  Get at least 30 minutes of physical activity on 5 or more days each week.  Avoid alcoholic and caffeinated beverages, as well as spicy foods. This may help prevent hot flashes.  Get 7-8 hours of sleep each night.  If you have hot flashes, try: ? Dressing in layers. ? Avoiding things that may trigger hot flashes, such as spicy food, warm places, or stress. ? Taking slow, deep breaths when a hot flash starts. ? Keeping a fan in your home and office.  Find ways to manage stress, such as deep breathing, meditation, or journaling.  Consider going to group therapy with other women who are having  menopause symptoms. Ask your health care provider about recommended group therapy meetings. Eating and drinking  Eat a healthy, balanced diet that contains whole grains, lean protein, low-fat dairy, and plenty of  fruits and vegetables.  Your health care provider may recommend adding more soy to your diet. Foods that contain soy include tofu, tempeh, and soy milk.  Eat plenty of foods that contain calcium and vitamin D for bone health. Items that are rich in calcium include low-fat milk, yogurt, beans, almonds, sardines, broccoli, and kale. Medicines  Take over-the-counter and prescription medicines only as told by your health care provider.  Talk with your health care provider before starting any herbal supplements. If prescribed, take vitamins and supplements as told by your health care provider. These may include: ? Calcium. Women age 62 and older should get 1,200 mg (milligrams) of calcium every day. ? Vitamin D. Women need 600-800 International Units of vitamin D each day. ? Vitamins B12 and B6. Aim for 50 micrograms of B12 and 1.5 mg of B6 each day. General instructions  Keep track of your menstrual periods, including: ? When they occur. ? How heavy they are and how long they last. ? How much time passes between periods.  Keep track of your symptoms, noting when they start, how often you have them, and how long they last.  Use vaginal lubricants or moisturizers to help with vaginal dryness and improve comfort during sex.  Keep all follow-up visits as told by your health care provider. This is important. This includes any group therapy or counseling. Contact a health care provider if:  You are still having menstrual periods after age 27.  You have pain during sex.  You have not had a period for 12 months and you develop vaginal bleeding. Get help right away if:  You have: ? Severe depression. ? Excessive vaginal bleeding. ? Pain when you urinate. ? A fast or irregular heart beat (palpitations). ? Severe headaches. ? Abdomen (abdominal) pain or severe indigestion.  You fell and you think you have a broken bone.  You develop leg or chest pain.  You develop vision problems.   You feel a lump in your breast. Summary  Menopause is the normal time of life when menstrual periods stop completely. It is usually confirmed by 12 months without a menstrual period.  The transition to menopause (perimenopause) most often happens between the ages of 84 and 80.  Symptoms can be managed through medicines, lifestyle changes, and complementary therapies such as acupuncture.  Eat a balanced diet that is rich in nutrients to promote bone health and heart health and to manage symptoms during menopause. This information is not intended to replace advice given to you by your health care provider. Make sure you discuss any questions you have with your health care provider. Document Released: 11/01/2003 Document Revised: 09/13/2016 Document Reviewed: 09/13/2016 Elsevier Interactive Patient Education  2019 [place] (urticaria) are itchy, red, swollen areas on the skin. Hives can appear on any part of the body. Hives often fade within 24 hours (acute hives). Sometimes, new hives appear after old ones fade and the cycle can continue for several days or weeks (chronic hives). Hives do not spread from person to person (are not contagious). Hives come from the body's reaction to something a person is allergic to (allergen), something that causes irritation, or various other triggers. When a person is exposed to a trigger, his or her body releases a chemical (histamine) that causes  redness, itching, and swelling. Hives can appear right after exposure to a trigger or hours later. What are the causes? This condition may be caused by:  Allergies to foods or ingredients.  Insect bites or stings.  Exposure to pollen or pets.  Contact with latex or chemicals.  Spending time in sunlight, heat, or cold (exposure).  Exercise.  Stress.  Certain medicines. You can also get hives from other medical conditions and treatments, such as:  Viruses, including the common cold.   Bacterial infections, such as urinary tract infections and strep throat.  Certain medicines.  Allergy shots.  Blood transfusions. Sometimes, the cause of this condition is not known (idiopathic hives). What increases the risk? You are more likely to develop this condition if you:  Are a woman.  Have food allergies, especially to citrus fruits, milk, eggs, peanuts, tree nuts, or shellfish.  Are allergic to: ? Medicines. ? Latex. ? Insects. ? Animals. ? Pollen. What are the signs or symptoms? Common symptoms of this condition include raised, itchy, red or white bumps or patches on your skin. These areas may:  Become large and swollen (welts).  Change in shape and location, quickly and repeatedly.  Be separate hives or connect over a large area of skin.  Sting or become painful.  Turn white when pressed in the center (blanch). In severe cases, yourhands, feet, and face may also become swollen. This may occur if hives develop deeper in your skin. How is this diagnosed? This condition may be diagnosed by your symptoms, medical history, and physical exam.  Your skin, urine, or blood may be tested to find out what is causing your hives and to rule out other health issues.  Your health care provider may also remove a small sample of skin from the affected area and examine it under a microscope (biopsy). How is this treated? Treatment for this condition depends on the cause and severity of your symptoms. Your health care provider may recommend using cool, wet cloths (cool compresses) or taking cool showers to relieve itching. Treatment may include:  Medicines that help: ? Relieve itching (antihistamines). ? Reduce swelling (corticosteroids). ? Treat infection (antibiotics).  An injectable medicine (omalizumab). Your health care provider may prescribe this if you have chronic idiopathic hives and you continue to have symptoms even after treatment with antihistamines. Severe  cases may require an emergency injection of adrenaline (epinephrine) to prevent a life-threatening allergic reaction (anaphylaxis). Follow these instructions at home: Medicines  Take and apply over-the-counter and prescription medicines only as told by your health care provider.  If you were prescribed an antibiotic medicine, take it as told by your health care provider. Do not stop using the antibiotic even if you start to feel better. Skin care  Apply cool compresses to the affected areas.  Do not scratch or rub your skin. General instructions  Do not take hot showers or baths. This can make itching worse.  Do not wear tight-fitting clothing.  Use sunscreen and wear protective clothing when you are outside.  Avoid any substances that cause your hives. Keep a journal to help track what causes your hives. Write down: ? What medicines you take. ? What you eat and drink. ? What products you use on your skin.  Keep all follow-up visits as told by your health care provider. This is important. Contact a health care provider if:  Your symptoms are not controlled with medicine.  Your joints are painful or swollen. Get help right away if:  You have a fever.  You have pain in your abdomen.  Your tongue or lips are swollen.  Your eyelids are swollen.  Your chest or throat feels tight.  You have trouble breathing or swallowing. These symptoms may represent a serious problem that is an emergency. Do not wait to see if the symptoms will go away. Get medical help right away. Call your local emergency services (911 in the [place] Do not drive yourself to the hospital. Summary  Hives (urticaria) are itchy, red, swollen areas on your skin. Hives come from the body's reaction to something a person is allergic to (allergen), something that causes irritation, or various other triggers.  Treatment for this condition depends on the cause and severity of your symptoms.  Avoid any  substances that cause your hives. Keep a journal to help track what causes your hives.  Take and apply over-the-counter and prescription medicines only as told by your health care provider.  Keep all follow-up visits as told by your health care provider. This is important. This information is not intended to replace advice given to you by your health care provider. Make sure you discuss any questions you have with your health care provider. Document Released: 08/11/2005 Document Revised: 02/24/2018 Document Reviewed: 02/24/2018 Elsevier Interactive Patient Education  2019 [employer].

## 2019-02-01 NOTE — Progress Notes (Signed)
Chief Complaint  Patient presents with  . Annual Exam   Annual doing well  1. Hives noted end of may early June tried otc allegra she had on arms, legs, stomach ? Trigger but she noted sneezing and nasal congestion and itchy watery eyes with sx's x a few days prior. No h/o significant allergies to cosmetic products prior to episode of hives.   2. Mood irritability she never tried effexor 37.5 mg qd due to c/w sexual side effects    Review of Systems  Constitutional: Negative for weight loss.  HENT: Negative for hearing loss and sore throat.   Eyes: Negative for blurred vision.  Respiratory: Negative for shortness of breath.   Cardiovascular: Negative for chest pain.  Gastrointestinal: Negative for abdominal pain.  Genitourinary:       +irregular menses sporadic Q2-3 months and lmp 2 weeks ago mom h/o early menopause in lower 43s    Skin: Positive for itching and rash.  Neurological: Negative for headaches.  Psychiatric/Behavioral: Negative for depression.   Past Medical History:  Diagnosis Date  . Allergy    Past Surgical History:  Procedure Laterality Date  . DILATION AND CURETTAGE OF UTERUS    . WISDOM TOOTH EXTRACTION     Family History  Problem Relation Age of Onset  . Diabetes Maternal Aunt   . Ovarian cancer Maternal Grandmother   . Prostate cancer Maternal Grandfather    Social History   Socioeconomic History  . Marital status: Married    Spouse name: Not on file  . Number of children: Not on file  . Years of education: Not on file  . Highest education level: Not on file  Occupational History  . Not on file  Social Needs  . Financial resource strain: Not on file  . Food insecurity:    Worry: Not on file    Inability: Not on file  . Transportation needs:    Medical: Not on file    Non-medical: Not on file  Tobacco Use  . Smoking status: Never Smoker  . Smokeless tobacco: Never Used  Substance and Sexual Activity  . Alcohol use: Yes    Alcohol/week:  0.0 - 1.0 standard drinks  . Drug use: No  . Sexual activity: Yes    Partners: Male  Lifestyle  . Physical activity:    Days per week: Not on file    Minutes per session: Not on file  . Stress: Not on file  Relationships  . Social connections:    Talks on phone: Not on file    Gets together: Not on file    Attends religious service: Not on file    Active member of club or organization: Not on file    Attends meetings of clubs or organizations: Not on file    Relationship status: Not on file  . Intimate partner violence:    Fear of current or ex partner: Not on file    Emotionally abused: Not on file    Physically abused: Not on file    Forced sexual activity: Not on file  Other Topics Concern  . Not on file  Social History Narrative   Works Ship broker in [place]    Married 2 kids boy 34 y.o and girl 49 y.o 12/22/17    Current Meds  Medication Sig  . [DISCONTINUED] venlafaxine XR (EFFEXOR XR) 37.5 MG 24 hr capsule Take 1 capsule (37.5 mg total) by mouth daily with breakfast.   Allergies  Allergen Reactions  .  Flonase [Fluticasone] Rash  . Penicillins Other (See Comments)   No results found for this or any previous visit (from the past 2160 hour(s)). Objective  Body mass index is 22.43 kg/m. Wt Readings from Last 3 Encounters:  02/01/19 134 lb 12.8 oz (61.1 kg)  06/24/18 136 lb (61.7 kg)  12/22/17 135 lb 3.2 oz (61.3 kg)   Temp Readings from Last 3 Encounters:  02/01/19 98.4 F (36.9 C) (Oral)  06/24/18 98.3 F (36.8 C) (Oral)  12/22/17 98.6 F (37 C) (Oral)   BP Readings from Last 3 Encounters:  02/01/19 134/82  06/24/18 (!) 90/56  12/22/17 102/64   Pulse Readings from Last 3 Encounters:  02/01/19 67  06/24/18 85  12/22/17 95    Physical Exam Vitals signs and nursing note reviewed.  Constitutional:      Appearance: Normal appearance. She is well-developed, well-groomed and normal weight.  HENT:     Head: Normocephalic and atraumatic.     Comments:  Mild cerumen right ear left ear clear     Nose: Nose normal.     Mouth/Throat:     Mouth: Mucous membranes are moist.     Pharynx: Oropharynx is clear.  Eyes:     Conjunctiva/sclera: Conjunctivae normal.     Pupils: Pupils are equal, round, and reactive to light.  Cardiovascular:     Rate and Rhythm: Normal rate and regular rhythm.     Heart sounds: Normal heart sounds. No murmur.  Pulmonary:     Effort: Pulmonary effort is normal.     Breath sounds: Normal breath sounds.  Skin:    General: Skin is warm and dry.     Comments: No active hives on skin today    Neurological:     General: No focal deficit present.     Mental Status: She is alert and oriented to person, place, and time. Mental status is at baseline.     Gait: Gait normal.  Psychiatric:        Attention and Perception: Attention and perception normal.        Mood and Affect: Mood and affect normal.        Speech: Speech normal.        Behavior: Behavior normal. Behavior is cooperative.        Thought Content: Thought content normal.        Cognition and Memory: Cognition and memory normal.        Judgment: Judgment normal.     Assessment   1. Annual  2. Hives ? Etiology, allergic rhinitis Plan   1.  sch fasting labs  Had flu shot 10/13/1 9 Tdap had 10/11/10  Hep B immune   Continue exercise and healthy diet choices  Pap 12/22/17 neg pap neg HPV  [facility] [place] [facility]18 negative -referred again today   Referred dermatology skin check tbse and patch test propylene glycol vs other saw 2019 negative will f/u prn with dermatology  Of note sporadic cycle like perimenopausal  2. Referred to Dr. Orvil Feil   Provider: Dr. Olivia Mackie [facility]

## 2019-02-01 NOTE — Progress Notes (Signed)
Pre visit review using our clinic review tool, if applicable. No additional management support is needed unless otherwise documented below in the visit note. 

## 2019-02-09 ENCOUNTER — Other Ambulatory Visit: Payer: Self-pay

## 2019-02-09 ENCOUNTER — Other Ambulatory Visit (INDEPENDENT_AMBULATORY_CARE_PROVIDER_SITE_OTHER): Payer: 59

## 2019-02-09 DIAGNOSIS — Z1329 Encounter for screening for other suspected endocrine disorder: Secondary | ICD-10-CM | POA: Diagnosis not present

## 2019-02-09 DIAGNOSIS — Z0000 Encounter for general adult medical examination without abnormal findings: Secondary | ICD-10-CM

## 2019-02-09 DIAGNOSIS — Z13220 Encounter for screening for lipoid disorders: Secondary | ICD-10-CM | POA: Diagnosis not present

## 2019-02-09 DIAGNOSIS — E559 Vitamin D deficiency, unspecified: Secondary | ICD-10-CM | POA: Diagnosis not present

## 2019-02-09 DIAGNOSIS — Z1389 Encounter for screening for other disorder: Secondary | ICD-10-CM

## 2019-02-09 LAB — CBC WITH DIFFERENTIAL/PLATELET
Basophils Absolute: 0.1 10*3/uL (ref 0.0–0.1)
Basophils Relative: 1.1 % (ref 0.0–3.0)
Eosinophils Absolute: 0.3 10*3/uL (ref 0.0–0.7)
Eosinophils Relative: 4.3 % (ref 0.0–5.0)
HCT: 39.6 % (ref 36.0–46.0)
Hemoglobin: 13.3 g/dL (ref 12.0–15.0)
Lymphocytes Relative: 39.7 % (ref 12.0–46.0)
Lymphs Abs: 2.7 10*3/uL (ref 0.7–4.0)
MCHC: 33.7 g/dL (ref 30.0–36.0)
MCV: 89.5 fl (ref 78.0–100.0)
Monocytes Absolute: 0.6 10*3/uL (ref 0.1–1.0)
Monocytes Relative: 8.3 % (ref 3.0–12.0)
Neutro Abs: 3.2 10*3/uL (ref 1.4–7.7)
Neutrophils Relative %: 46.6 % (ref 43.0–77.0)
Platelets: 280 10*3/uL (ref 150.0–400.0)
RBC: 4.42 Mil/uL (ref 3.87–5.11)
RDW: 13.4 % (ref 11.5–15.5)
WBC: 6.8 10*3/uL (ref 4.0–10.5)

## 2019-02-09 LAB — COMPREHENSIVE METABOLIC PANEL
ALT: 13 U/L (ref 0–35)
AST: 18 U/L (ref 0–37)
Albumin: 4.3 g/dL (ref 3.5–5.2)
Alkaline Phosphatase: 71 U/L (ref 39–117)
BUN: 20 mg/dL (ref 6–23)
CO2: 28 mEq/L (ref 19–32)
Calcium: 9.3 mg/dL (ref 8.4–10.5)
Chloride: 104 mEq/L (ref 96–112)
Creatinine, Ser: 1.07 mg/dL (ref 0.40–1.20)
GFR: 55.8 mL/min — ABNORMAL LOW (ref >60.00)
Glucose, Bld: 81 mg/dL (ref 70–99)
Potassium: 4 mEq/L (ref 3.5–5.1)
Sodium: 139 mEq/L (ref 135–145)
Total Bilirubin: 0.8 mg/dL (ref 0.2–1.2)
Total Protein: 6.9 g/dL (ref 6.0–8.3)

## 2019-02-09 LAB — LIPID PANEL
Cholesterol: 165 mg/dL (ref 0–200)
HDL: 53.2 mg/dL (ref >39.00)
LDL Cholesterol: 85 mg/dL (ref 0–99)
NonHDL: 111.5
Total CHOL/HDL Ratio: 3
Triglycerides: 134 mg/dL (ref 0.0–149.0)
VLDL: 26.8 mg/dL (ref 0.0–40.0)

## 2019-02-09 LAB — TSH: TSH: 2.1 u[IU]/mL (ref 0.35–4.50)

## 2019-02-09 LAB — VITAMIN D 25 HYDROXY (VIT D DEFICIENCY, FRACTURES): VITD: 31.63 ng/mL (ref 30.00–100.00)

## 2019-02-10 LAB — URINALYSIS, ROUTINE W REFLEX MICROSCOPIC
Bacteria, UA: NONE SEEN /HPF
Bilirubin Urine: NEGATIVE
Glucose, UA: NEGATIVE
Hgb urine dipstick: NEGATIVE
Hyaline Cast: NONE SEEN /LPF
Ketones, ur: NEGATIVE
Nitrite: NEGATIVE
RBC / HPF: NONE SEEN /HPF (ref 0–2)
Specific Gravity, Urine: 1.019 (ref 1.001–1.03)
pH: <=5 (ref 5.0–8.0)

## 2019-02-28 LAB — HM MAMMOGRAPHY

## 2019-03-01 ENCOUNTER — Encounter: Payer: Self-pay | Admitting: Internal Medicine

## 2019-03-15 ENCOUNTER — Ambulatory Visit (INDEPENDENT_AMBULATORY_CARE_PROVIDER_SITE_OTHER): Payer: 59 | Admitting: Family Medicine

## 2019-03-15 ENCOUNTER — Other Ambulatory Visit: Payer: Self-pay

## 2019-03-15 DIAGNOSIS — J029 Acute pharyngitis, unspecified: Secondary | ICD-10-CM

## 2019-03-15 DIAGNOSIS — R519 Headache, unspecified: Secondary | ICD-10-CM

## 2019-03-15 DIAGNOSIS — R05 Cough: Secondary | ICD-10-CM

## 2019-03-15 DIAGNOSIS — R0981 Nasal congestion: Secondary | ICD-10-CM | POA: Diagnosis not present

## 2019-03-15 DIAGNOSIS — R059 Cough, unspecified: Secondary | ICD-10-CM

## 2019-03-15 DIAGNOSIS — Z20828 Contact with and (suspected) exposure to other viral communicable diseases: Secondary | ICD-10-CM

## 2019-03-15 DIAGNOSIS — Z20822 Contact with and (suspected) exposure to covid-19: Secondary | ICD-10-CM

## 2019-03-15 DIAGNOSIS — R51 Headache: Secondary | ICD-10-CM

## 2019-03-15 NOTE — Progress Notes (Signed)
Patient ID: Erika [name], female   DOB: 15-Oct-1975, 43 y.o.   MRN: [id number]    Virtual Visit via video Note  This visit type was conducted due to national recommendations for restrictions regarding the COVID-19 pandemic (e.g. social distancing).  This format is felt to be most appropriate for this patient at this time.  All issues noted in this document were discussed and addressed.  No physical exam was performed (except for noted visual exam findings with Video Visits).   I connected with Dewain Penning today at  2:00 PM EDT by a video enabled telemedicine application and verified that I am speaking with the correct person using two identifiers. Location patient: home Location provider: work or home office Persons participating in the virtual visit: patient, provider  I discussed the limitations, risks, security and privacy concerns of performing an evaluation and management service by video and the availability of in person appointments. I also discussed with the patient that there may be a patient responsible charge related to this service. The patient expressed understanding and agreed to proceed.   HPI:  Patient and I connected via video due to complaints of sore throat, congestion and headache.  Patient daughter was tested for COVID-19 yesterday due to similar symptoms.  Patient symptoms began this morning.  Denies any fever or chills.  Denies body aches, shortness of breath or wheezing.  Does have slight cough.  Denies nausea, vomiting or diarrhea.   ROS: See pertinent positives and negatives per HPI.  Past Medical History:  Diagnosis Date  . Allergy     Past Surgical History:  Procedure Laterality Date  . DILATION AND CURETTAGE OF UTERUS    . WISDOM TOOTH EXTRACTION      Family History  Problem Relation Age of Onset  . Diabetes Maternal Aunt   . Ovarian cancer Maternal Grandmother   . Prostate cancer Maternal Grandfather     Social History   Tobacco Use  .  Smoking status: Never Smoker  . Smokeless tobacco: Never Used  Substance Use Topics  . Alcohol use: Yes    Alcohol/week: 0.0 - 1.0 standard drinks     EXAM:  GENERAL: alert, oriented, appears well and in no acute distress  HEENT: atraumatic, conjunttiva clear, no obvious abnormalities on inspection of external nose and ears  NECK: normal movements of the head and neck  LUNGS: on inspection no signs of respiratory distress, breathing rate appears normal, no obvious gross SOB, gasping or wheezing  CV: no obvious cyanosis  MS: moves all visible extremities without noticeable abnormality  PSYCH/NEURO: pleasant and cooperative, no obvious depression or anxiety, speech and thought processing grossly intact  ASSESSMENT AND PLAN:  Discussed the following assessment and plan:  Suspected COVID-19 virus infection, sore throat, nasal congestion, headache, cough - advised that due to symptoms, we need to get patient set up for COVID-19 testing.  Patient advised that I will put order in and he can go to testing location for for drive-through testing. Patient given the address of testing site.  Patient advised that testing is taking 2 to 7 days to result, and while we are awaiting results patient must remain under self quarantine and monitor for any changing/worsening symptoms.  Advised over-the-counter medications such as Tylenol can be used to help treat pain or fevers, Robitussin can be used to help calm cough, allergy medication such as Claritin or Allegra can help reduce congestion.  Also discussed getting plenty of rest and increasing fluid intake.  Made patient  aware that test results as well as how his symptoms progress will determine when the self quarantine will be able to end.  Also advised to monitor self for any worsening symptoms, advised if severe shortness of breath develops, high fever that is not reduced with use of Tylenol, chest pain, severe vomiting or diarrhea  --patient must call  on-call and or go to ER right away for evaluation. patient verbalized understanding of these instructions.    I discussed the assessment and treatment plan with the patient. The patient was provided an opportunity to ask questions and all were answered. The patient agreed with the plan and demonstrated an understanding of the instructions.   The patient was advised to call back or seek an in-person evaluation if the symptoms worsen or if the condition fails to improve as anticipated.  Jodelle Green, FNP

## 2019-03-17 LAB — NOVEL CORONAVIRUS, NAA: SARS-CoV-2, NAA: NOT DETECTED

## 2019-07-04 ENCOUNTER — Other Ambulatory Visit: Payer: Self-pay

## 2019-07-04 DIAGNOSIS — Z20822 Contact with and (suspected) exposure to covid-19: Secondary | ICD-10-CM

## 2019-07-07 LAB — NOVEL CORONAVIRUS, NAA: SARS-CoV-2, NAA: NOT DETECTED

## 2019-11-28 ENCOUNTER — Other Ambulatory Visit: Payer: Self-pay

## 2019-11-28 ENCOUNTER — Ambulatory Visit: Payer: 59 | Attending: Internal Medicine

## 2019-11-28 DIAGNOSIS — Z23 Encounter for immunization: Secondary | ICD-10-CM

## 2019-11-28 NOTE — Progress Notes (Signed)
   Covid-19 Vaccination Clinic  Name:  [name]    MRN: [id number] DOB: May 08, 1976  11/28/2019  [name] was observed post Covid-19 immunization for 15 minutes without incident. She was provided with Vaccine Information Sheet and instruction to access the V-Safe system.   [name] was instructed to call 911 with any severe reactions post vaccine: Marland Kitchen Difficulty breathing  . Swelling of face and throat  . A fast heartbeat  . A bad rash all over body  . Dizziness and weakness   Immunizations Administered    Name Date Dose VIS Date Route   Pfizer COVID-19 Vaccine 11/28/2019  9:14 AM 0.3 mL 08/05/2019 Intramuscular   Manufacturer: [place]   Lot: [id number]   [place]: [id number]

## 2019-12-21 ENCOUNTER — Ambulatory Visit: Payer: 59 | Attending: Internal Medicine

## 2019-12-21 DIAGNOSIS — Z23 Encounter for immunization: Secondary | ICD-10-CM

## 2019-12-21 NOTE — Progress Notes (Signed)
   Covid-19 Vaccination Clinic  Name:  Erika [name]    MRN: [id number] DOB: 18-Sep-1975  12/21/2019  Erika [name] was observed post Covid-19 immunization for 30 minutes without incident. She was provided with Vaccine Information Sheet and instruction to access the V-Safe system. She complained of tightness in her left hand, no visual signs of swelling. She states the same thing happened with the first injection and it went away after about an hour. We kept her an additional 15 minutes in observation and there was no change, she left with instructions on when to call 911.  Erika [name] was instructed to call 911 with any severe reactions post vaccine: Marland Kitchen Difficulty breathing  . Swelling of face and throat  . A fast heartbeat  . A bad rash all over body  . Dizziness and weakness   Immunizations Administered    Name Date Dose VIS Date Route   Pfizer COVID-19 Vaccine 12/21/2019  9:40 AM 0.3 mL 10/19/2018 Intramuscular   Manufacturer: [place]   Lot: [id number]   [place]: [id number]

## 2020-01-25 ENCOUNTER — Encounter: Payer: 59 | Admitting: Internal Medicine

## 2020-02-03 ENCOUNTER — Encounter: Payer: 59 | Admitting: Internal Medicine

## 2020-02-08 ENCOUNTER — Other Ambulatory Visit: Payer: Self-pay

## 2020-02-08 ENCOUNTER — Encounter: Payer: Self-pay | Admitting: Internal Medicine

## 2020-02-08 ENCOUNTER — Ambulatory Visit (INDEPENDENT_AMBULATORY_CARE_PROVIDER_SITE_OTHER): Payer: 59 | Admitting: Internal Medicine

## 2020-02-08 VITALS — BP 118/80 | HR 50 | Temp 97.2°F | Ht 64.92 in | Wt 139.0 lb

## 2020-02-08 DIAGNOSIS — Z1231 Encounter for screening mammogram for malignant neoplasm of breast: Secondary | ICD-10-CM

## 2020-02-08 DIAGNOSIS — Z1329 Encounter for screening for other suspected endocrine disorder: Secondary | ICD-10-CM | POA: Diagnosis not present

## 2020-02-08 DIAGNOSIS — R809 Proteinuria, unspecified: Secondary | ICD-10-CM

## 2020-02-08 DIAGNOSIS — R001 Bradycardia, unspecified: Secondary | ICD-10-CM | POA: Diagnosis not present

## 2020-02-08 DIAGNOSIS — Z13220 Encounter for screening for lipoid disorders: Secondary | ICD-10-CM

## 2020-02-08 DIAGNOSIS — Z0000 Encounter for general adult medical examination without abnormal findings: Secondary | ICD-10-CM

## 2020-02-08 HISTORY — DX: Bradycardia, unspecified: R00.1

## 2020-02-08 LAB — LIPID PANEL
Cholesterol: 159 mg/dL (ref 0–200)
HDL: 59.3 mg/dL (ref >39.00)
LDL Cholesterol: 87 mg/dL (ref 0–99)
NonHDL: 99.67
Total CHOL/HDL Ratio: 3
Triglycerides: 63 mg/dL (ref 0.0–149.0)
VLDL: 12.6 mg/dL (ref 0.0–40.0)

## 2020-02-08 LAB — CBC WITH DIFFERENTIAL/PLATELET
Basophils Absolute: 0.1 10*3/uL (ref 0.0–0.1)
Basophils Relative: 0.9 % (ref 0.0–3.0)
Eosinophils Absolute: 0.1 10*3/uL (ref 0.0–0.7)
Eosinophils Relative: 0.8 % (ref 0.0–5.0)
HCT: 38.8 % (ref 36.0–46.0)
Hemoglobin: 13 g/dL (ref 12.0–15.0)
Lymphocytes Relative: 32.2 % (ref 12.0–46.0)
Lymphs Abs: 2.3 10*3/uL (ref 0.7–4.0)
MCHC: 33.4 g/dL (ref 30.0–36.0)
MCV: 90 fl (ref 78.0–100.0)
Monocytes Absolute: 0.5 10*3/uL (ref 0.1–1.0)
Monocytes Relative: 7.7 % (ref 3.0–12.0)
Neutro Abs: 4.1 10*3/uL (ref 1.4–7.7)
Neutrophils Relative %: 58.4 % (ref 43.0–77.0)
Platelets: 247 10*3/uL (ref 150.0–400.0)
RBC: 4.31 Mil/uL (ref 3.87–5.11)
RDW: 13.8 % (ref 11.5–15.5)
WBC: 7 10*3/uL (ref 4.0–10.5)

## 2020-02-08 LAB — COMPREHENSIVE METABOLIC PANEL
ALT: 12 U/L (ref 0–35)
AST: 21 U/L (ref 0–37)
Albumin: 4.3 g/dL (ref 3.5–5.2)
Alkaline Phosphatase: 84 U/L (ref 39–117)
BUN: 16 mg/dL (ref 6–23)
CO2: 29 mEq/L (ref 19–32)
Calcium: 9.1 mg/dL (ref 8.4–10.5)
Chloride: 104 mEq/L (ref 96–112)
Creatinine, Ser: 0.94 mg/dL (ref 0.40–1.20)
GFR: 64.5 mL/min (ref >60.00)
Glucose, Bld: 86 mg/dL (ref 70–99)
Potassium: 4 mEq/L (ref 3.5–5.1)
Sodium: 138 mEq/L (ref 135–145)
Total Bilirubin: 0.7 mg/dL (ref 0.2–1.2)
Total Protein: 7 g/dL (ref 6.0–8.3)

## 2020-02-08 LAB — TSH: TSH: 1.5 u[IU]/mL (ref 0.35–4.50)

## 2020-02-08 NOTE — Patient Instructions (Addendum)
Vitamin D3 rec 2000 to 4000 IU daily  Debrox ear wax drops    Venlafaxine extended-release capsules What is this medicine? VENLAFAXINE(VEN la fax een) is used to treat depression, anxiety and panic disorder. This medicine may be used for other purposes; ask your health care provider or pharmacist if you have questions. COMMON BRAND NAME(S): Effexor XR What should I tell my health care provider before I take this medicine? They need to know if you have any of these conditions:  bleeding disorders  glaucoma  heart disease  high blood pressure  high cholesterol  kidney disease  liver disease  low levels of sodium in the blood  mania or bipolar disorder  seizures  suicidal thoughts, plans, or attempt; a previous suicide attempt by you or a family  take medicines that treat or prevent blood clots  thyroid disease  an unusual or allergic reaction to venlafaxine, desvenlafaxine, other medicines, foods, dyes, or preservatives  pregnant or trying to get pregnant  breast-feeding How should I use this medicine? Take this medicine by mouth with a full glass of water. Follow the directions on the prescription label. Do not cut, crush, or chew this medicine. Take it with food. If needed, the capsule may be carefully opened and the entire contents sprinkled on a spoonful of cool applesauce. Swallow the applesauce/pellet mixture right away without chewing and follow with a glass of water to ensure complete swallowing of the pellets. Try to take your medicine at about the same time each day. Do not take your medicine more often than directed. Do not stop taking this medicine suddenly except upon the advice of your doctor. Stopping this medicine too quickly may cause serious side effects or your condition may worsen. A special MedGuide will be given to you by the pharmacist with each prescription and refill. Be sure to read this information carefully each time. Talk to your pediatrician  regarding the use of this medicine in children. Special care may be needed. Overdosage: If you think you have taken too much of this medicine contact a poison control center or emergency room at once. NOTE: This medicine is only for you. Do not share this medicine with others. What if I miss a dose? If you miss a dose, take it as soon as you can. If it is almost time for your next dose, take only that dose. Do not take double or extra doses. What may interact with this medicine? Do not take this medicine with any of the following medications:  certain medicines for fungal infections like fluconazole, itraconazole, ketoconazole, posaconazole, voriconazole  cisapride  desvenlafaxine  dronedarone  duloxetine  levomilnacipran  linezolid  MAOIs like Carbex, Eldepryl, Marplan, Nardil, and Parnate  methylene blue (injected into a vein)  milnacipran  pimozide  thioridazine This medicine may also interact with the following medications:  amphetamines  aspirin and aspirin-like medicines  certain medicines for depression, anxiety, or psychotic disturbances  certain medicines for migraine headaches like almotriptan, eletriptan, frovatriptan, naratriptan, rizatriptan, sumatriptan, zolmitriptan  certain medicines for sleep  certain medicines that treat or prevent blood clots like dalteparin, enoxaparin, warfarin  cimetidine  clozapine  diuretics  fentanyl  furazolidone  indinavir  isoniazid  lithium  metoprolol  NSAIDS, medicines for pain and inflammation, like ibuprofen or naproxen  other medicines that prolong the QT interval (cause an abnormal heart rhythm) like dofetilide, ziprasidone  procarbazine  rasagiline  supplements like St. John's wort, kava kava, valerian  tramadol  tryptophan This list may  not describe all possible interactions. Give your health care provider a list of all the medicines, herbs, non-prescription drugs, or dietary supplements  you use. Also tell them if you smoke, drink alcohol, or use illegal drugs. Some items may interact with your medicine. What should I watch for while using this medicine? Tell your doctor if your symptoms do not get better or if they get worse. Visit your doctor or health care professional for regular checks on your progress. Because it may take several weeks to see the full effects of this medicine, it is important to continue your treatment as prescribed by your doctor. Patients and their families should watch out for new or worsening thoughts of suicide or depression. Also watch out for sudden changes in feelings such as feeling anxious, agitated, panicky, irritable, hostile, aggressive, impulsive, severely restless, overly excited and hyperactive, or not being able to sleep. If this happens, especially at the beginning of treatment or after a change in dose, call your health care professional. This medicine can cause an increase in blood pressure. Check with your doctor for instructions on monitoring your blood pressure while taking this medicine. You may get drowsy or dizzy. Do not drive, use machinery, or do anything that needs mental alertness until you know how this medicine affects you. Do not stand or sit up quickly, especially if you are an older patient. This reduces the risk of dizzy or fainting spells. Alcohol may interfere with the effect of this medicine. Avoid alcoholic drinks. Your mouth may get dry. Chewing sugarless gum, sucking hard candy and drinking plenty of water will help. Contact your doctor if the problem does not go away or is severe. What side effects may I notice from receiving this medicine? Side effects that you should report to your doctor or health care professional as soon as possible:  allergic reactions like skin rash, itching or hives, swelling of the face, lips, or tongue  anxious  breathing problems  confusion  changes in vision  chest  pain  confusion  elevated mood, decreased need for sleep, racing thoughts, impulsive behavior  eye pain  fast, irregular heartbeat  feeling faint or lightheaded, falls  feeling agitated, angry, or irritable  hallucination, loss of contact with reality  high blood pressure  loss of balance or coordination  palpitations  redness, blistering, peeling or loosening of the skin, including inside the mouth  restlessness, pacing, inability to keep still  seizures  stiff muscles  suicidal thoughts or other mood changes  trouble passing urine or change in the amount of urine  trouble sleeping  unusual bleeding or bruising  unusually weak or tired  vomiting Side effects that usually do not require medical attention (report to your doctor or health care professional if they continue or are bothersome):  change in sex drive or performance  change in appetite or weight  constipation  dizziness  dry mouth  headache  increased sweating  nausea  tired This list may not describe all possible side effects. Call your doctor for medical advice about side effects. You may report side effects to FDA at 1-800-FDA-1088. Where should I keep my medicine? Keep out of the reach of children. Store at a controlled temperature between 20 and 25 degrees C (68 degrees and 77 degrees F), in a dry place. Throw away any unused medicine after the expiration date. NOTE: This sheet is a summary. It may not cover all possible information. If you have questions about this medicine, talk to  your doctor, pharmacist, or health care provider.  2020 Elsevier/Gold Standard (2018-08-03 12:06:43)  Menopause Menopause is the normal time of life when menstrual periods stop completely. It is usually confirmed by 12 months without a menstrual period. The transition to menopause (perimenopause) most often happens between the ages of 92 and 39. During perimenopause, hormone levels change in your body,  which can cause symptoms and affect your health. Menopause may increase your risk for:  Loss of bone (osteoporosis), which causes bone breaks (fractures).  Depression.  Hardening and narrowing of the arteries (atherosclerosis), which can cause heart attacks and strokes. What are the causes? This condition is usually caused by a natural change in hormone levels that happens as you get older. The condition may also be caused by surgery to remove both ovaries (bilateral oophorectomy). What increases the risk? This condition is more likely to start at an earlier age if you have certain medical conditions or treatments, including:  A tumor of the pituitary gland in the brain.  A disease that affects the ovaries and hormone production.  Radiation treatment for cancer.  Certain cancer treatments, such as chemotherapy or hormone (anti-estrogen) therapy.  Heavy smoking and excessive alcohol use.  Family history of early menopause. This condition is also more likely to develop earlier in women who are very thin. What are the signs or symptoms? Symptoms of this condition include:  Hot flashes.  Irregular menstrual periods.  Night sweats.  Changes in feelings about sex. This could be a decrease in sex drive or an increased comfort around your sexuality.  Vaginal dryness and thinning of the vaginal walls. This may cause painful intercourse.  Dryness of the skin and development of wrinkles.  Headaches.  Problems sleeping (insomnia).  Mood swings or irritability.  Memory problems.  Weight gain.  Hair growth on the face and chest.  Bladder infections or problems with urinating. How is this diagnosed? This condition is diagnosed based on your medical history, a physical exam, your age, your menstrual history, and your symptoms. Hormone tests may also be done. How is this treated? In some cases, no treatment is needed. You and your health care provider should make a decision  together about whether treatment is necessary. Treatment will be based on your individual condition and preferences. Treatment for this condition focuses on managing symptoms. Treatment may include:  Menopausal hormone therapy (MHT).  Medicines to treat specific symptoms or complications.  Acupuncture.  Vitamin or herbal supplements. Before starting treatment, make sure to let your health care provider know if you have a personal or family history of:  Heart disease.  Breast cancer.  Blood clots.  Diabetes.  Osteoporosis. Follow these instructions at home: Lifestyle  Do not use any products that contain nicotine or tobacco, such as cigarettes and e-cigarettes. If you need help quitting, ask your health care provider.  Get at least 30 minutes of physical activity on 5 or more days each week.  Avoid alcoholic and caffeinated beverages, as well as spicy foods. This may help prevent hot flashes.  Get 7-8 hours of sleep each night.  If you have hot flashes, try: ? Dressing in layers. ? Avoiding things that may trigger hot flashes, such as spicy food, warm places, or stress. ? Taking slow, deep breaths when a hot flash starts. ? Keeping a fan in your home and office.  Find ways to manage stress, such as deep breathing, meditation, or journaling.  Consider going to group therapy with other women who are  having menopause symptoms. Ask your health care provider about recommended group therapy meetings. Eating and drinking  Eat a healthy, balanced diet that contains whole grains, lean protein, low-fat dairy, and plenty of fruits and vegetables.  Your health care provider may recommend adding more soy to your diet. Foods that contain soy include tofu, tempeh, and soy milk.  Eat plenty of foods that contain calcium and vitamin D for bone health. Items that are rich in calcium include low-fat milk, yogurt, beans, almonds, sardines, broccoli, and kale. Medicines  Take  over-the-counter and prescription medicines only as told by your health care provider.  Talk with your health care provider before starting any herbal supplements. If prescribed, take vitamins and supplements as told by your health care provider. These may include: ? Calcium. Women age 26 and older should get 1,200 mg (milligrams) of calcium every day. ? Vitamin D. Women need 600-800 International Units of vitamin D each day. ? Vitamins B12 and B6. Aim for 50 micrograms of B12 and 1.5 mg of B6 each day. General instructions  Keep track of your menstrual periods, including: ? When they occur. ? How heavy they are and how long they last. ? How much time passes between periods.  Keep track of your symptoms, noting when they start, how often you have them, and how long they last.  Use vaginal lubricants or moisturizers to help with vaginal dryness and improve comfort during sex.  Keep all follow-up visits as told by your health care provider. This is important. This includes any group therapy or counseling. Contact a health care provider if:  You are still having menstrual periods after age 41.  You have pain during sex.  You have not had a period for 12 months and you develop vaginal bleeding. Get help right away if:  You have: ? Severe depression. ? Excessive vaginal bleeding. ? Pain when you urinate. ? A fast or irregular heart beat (palpitations). ? Severe headaches. ? Abdomen (abdominal) pain or severe indigestion.  You fell and you think you have a broken bone.  You develop leg or chest pain.  You develop vision problems.  You feel a lump in your breast. Summary  Menopause is the normal time of life when menstrual periods stop completely. It is usually confirmed by 12 months without a menstrual period.  The transition to menopause (perimenopause) most often happens between the ages of 51 and 30.  Symptoms can be managed through medicines, lifestyle changes, and  complementary therapies such as acupuncture.  Eat a balanced diet that is rich in nutrients to promote bone health and heart health and to manage symptoms during menopause. This information is not intended to replace advice given to you by your health care provider. Make sure you discuss any questions you have with your health care provider. Document Revised: 07/24/2017 Document Reviewed: 09/13/2016 Elsevier Patient Education  [place].   Bradycardia, Adult Bradycardia is a slower-than-normal heartbeat. A normal resting heart rate for an adult ranges from 60 to 100 beats per minute. With bradycardia, the resting heart rate is less than 60 beats per minute. Bradycardia can prevent enough oxygen from reaching certain areas of your body when you are active. It can be serious if it keeps enough oxygen from reaching your brain and other parts of your body. Bradycardia is not a problem for everyone. For some healthy adults, a slow resting heart rate is normal. What are the causes? This condition may be caused by:  A  problem with the heart, including: ? A problem with the heart's electrical system, such as a heart block. With a heart block, electrical signals between the chambers of the heart are partially or completely blocked, so they are not able to work as they should. ? A problem with the heart's natural pacemaker (sinus node). ? Heart disease. ? A heart attack. ? Heart damage. ? Lyme disease. ? A heart infection. ? A heart condition that is present at birth (congenital heart defect).  Certain medicines that treat heart conditions.  Certain conditions, such as hypothyroidism and obstructive sleep apnea.  Problems with the balance of chemicals and other substances, like potassium, in the blood.  Trauma.  Radiation therapy. What increases the risk? You are more likely to develop this condition if you:  Are age 70 or older.  Have high blood pressure (hypertension), high  cholesterol (hyperlipidemia), or diabetes.  Drink heavily, use tobacco or nicotine products, or use drugs. What are the signs or symptoms? Symptoms of this condition include:  Light-headedness.  Feeling faint or fainting.  Fatigue and weakness.  Trouble with activity or exercise.  Shortness of breath.  Chest pain (angina).  Drowsiness.  Confusion.  Dizziness. How is this diagnosed? This condition may be diagnosed based on:  Your symptoms.  Your medical history.  A physical exam. During the exam, your health care provider will listen to your heartbeat and check your pulse. To confirm the diagnosis, your health care provider may order tests, such as:  Blood tests.  An electrocardiogram (ECG). This test records the heart's electrical activity. The test can show how fast your heart is beating and whether the heartbeat is steady.  A test in which you wear a portable device (event recorder or Holter monitor) to record your heart's electrical activity while you go about your day.  Anexercise test. How is this treated? Treatment for this condition depends on the cause of the condition and how severe your symptoms are. Treatment may involve:  Treatment of the underlying condition.  Changing your medicines or how much medicine you take.  Having a small, battery-operated device called a pacemaker implanted under the skin. When bradycardia occurs, this device can be used to increase your heart rate and help your heart beat in a regular rhythm. Follow these instructions at home: Lifestyle   Manage any health conditions that contribute to bradycardia as told by your health care provider.  Follow a heart-healthy diet. A nutrition specialist (dietitian) can help educate you about healthy food options and changes.  Follow an exercise program that is approved by your health care provider.  Maintain a healthy weight.  Try to reduce or manage your stress, such as with yoga or  meditation. If you need help reducing stress, ask your health care provider.  Do not use any products that contain nicotine or tobacco, such as cigarettes, e-cigarettes, and chewing tobacco. If you need help quitting, ask your health care provider.  Do not use illegal drugs.  Limit alcohol intake to no more than 1 drink a day for nonpregnant women and 2 drinks a day for men. Be aware of how much alcohol is in your drink. In the [place] one drink equals one 12 oz bottle of beer (355 mL), one 5 oz glass of wine (148 mL), or one 1 oz glass of hard liquor (44 mL). General instructions  Take over-the-counter and prescription medicines only as told by your health care provider.  Keep all follow-up visits as told by  your health care provider. This is important. How is this prevented? In some cases, bradycardia may be prevented by:  Treating underlying medical problems.  Stopping behaviors or medicines that can trigger the condition. Contact a health care provider if you:  Feel light-headed or dizzy.  Almost faint.  Feel weak or are easily fatigued during physical activity.  Experience confusion or have memory problems. Get help right away if:  You faint.  You have: ? An irregular heartbeat (palpitations). ? Chest pain. ? Trouble breathing. Summary  Bradycardia is a slower-than-normal heartbeat. With bradycardia, the resting heart rate is less than 60 beats per minute.  Treatment for this condition depends on the cause.  Manage any health conditions that contribute to bradycardia as told by your health care provider.  Do not use any products that contain nicotine or tobacco, such as cigarettes, e-cigarettes, and chewing tobacco, and limit alcohol intake.  Keep all follow-up visits as told by your health care provider. This is important. This information is not intended to replace advice given to you by your health care provider. Make sure you discuss any questions you have with  your health care provider. Document Revised: 02/22/2018 Document Reviewed: 01/20/2018 Elsevier Patient Education  [place].

## 2020-02-08 NOTE — Progress Notes (Signed)
Patient's heart rate was 45 upon first check. 26 entered in error.  Repeated check manually and was 50 bpm.

## 2020-02-08 NOTE — Progress Notes (Signed)
Chief Complaint  Patient presents with  . Annual Exam   Annual  1. C/o hot flashes and elevated FSH 32.2 taking moon balance supplement with baoba, maca, Hibiscus, amla, beetroot hot flashes worse with stress and at night powder supplement helps with hot flashes 2. Sinus bradycardia w/o sx's she does do cardio   Review of Systems  Constitutional: Negative for weight loss.  HENT: Negative for hearing loss.   Eyes: Negative for blurred vision.  Respiratory: Negative for shortness of breath.   Cardiovascular: Negative for chest pain.  Gastrointestinal: Negative for abdominal pain.  Genitourinary:       +hot flashes   Musculoskeletal: Negative for myalgias.  Skin: Negative for rash.  Neurological: Negative for headaches.  Psychiatric/Behavioral: Negative for depression. The patient is not nervous/anxious.    Past Medical History:  Diagnosis Date  . Allergy    Past Surgical History:  Procedure Laterality Date  . DILATION AND CURETTAGE OF UTERUS    . WISDOM TOOTH EXTRACTION     Family History  Problem Relation Age of Onset  . Diabetes Maternal Aunt   . Ovarian cancer Maternal Grandmother   . Prostate cancer Maternal Grandfather   . ADD / ADHD Daughter        44 y.o as of 02/08/20   Social History   Socioeconomic History  . Marital status: Married    Spouse name: Not on file  . Number of children: Not on file  . Years of education: Not on file  . Highest education level: Not on file  Occupational History  . Not on file  Tobacco Use  . Smoking status: Never Smoker  . Smokeless tobacco: Never Used  Substance and Sexual Activity  . Alcohol use: Yes    Alcohol/week: 0.0 - 1.0 standard drinks  . Drug use: No  . Sexual activity: Yes    Partners: Male  Other Topics Concern  . 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  Difficulty of Paying Living Expenses:   Food Insecurity:   . Worried About Charity fundraiser in the Last Year:   . Arboriculturist in the Last Year:   Transportation Needs:   . Film/video editor (Medical):   Marland Kitchen Lack of Transportation (Non-Medical):   Physical Activity:   . Days of Exercise per Week:   . Minutes of Exercise per Session:   Stress:   . Feeling of Stress :   Social Connections:   . Frequency of Communication with Friends and Family:   . Frequency of Social Gatherings with Friends and Family:   . Attends Religious Services:   . Active Member of Clubs or Organizations:   . Attends Archivist Meetings:   Marland Kitchen Marital Status:   Intimate Partner Violence:   . Fear of Current or Ex-Partner:   . Emotionally Abused:   Marland Kitchen Physically Abused:   . Sexually Abused:    No outpatient medications have been marked as taking for the 02/08/20 encounter (Office Visit) with McLean-Scocuzza, Nino Glow, MD.   Allergies  Allergen Reactions  . Flonase [Fluticasone] Rash  . Penicillins Other (See Comments)   No results found for this or any previous visit (from the past 2160 hour(s)). Objective  Body mass index is 23.19 kg/m. Wt Readings  from Last 3 Encounters:  02/08/20 139 lb (63 kg)  02/01/19 134 lb 12.8 oz (61.1 kg)  06/24/18 136 lb (61.7 kg)   Temp Readings from Last 3 Encounters:  02/08/20 (!) 97.2 F (36.2 C) (Temporal)  02/01/19 98.4 F (36.9 C) (Oral)  06/24/18 98.3 F (36.8 C) (Oral)   BP Readings from Last 3 Encounters:  02/08/20 118/80  02/01/19 134/82  06/24/18 (!) 90/56   Pulse Readings from Last 3 Encounters:  02/08/20 (!) 50  02/01/19 67  06/24/18 85    Physical Exam Vitals and nursing note reviewed.  Constitutional:      Appearance: Normal appearance. She is well-developed and well-groomed.  HENT:     Head: Normocephalic and atraumatic.  Eyes:     Conjunctiva/sclera: Conjunctivae normal.     Pupils: Pupils are equal, round, and reactive  to light.  Cardiovascular:     Rate and Rhythm: Regular rhythm. Bradycardia present.     Heart sounds: Normal heart sounds. No murmur heard.   Pulmonary:     Effort: Pulmonary effort is normal.     Breath sounds: Normal breath sounds.  Chest:     Breasts: Breasts are symmetrical.        Right: Normal.        Left: Normal.     Comments: Normal breast exam  Abdominal:     General: Abdomen is flat. Bowel sounds are normal.     Tenderness: There is no abdominal tenderness.  Lymphadenopathy:     Upper Body:     Right upper body: No axillary adenopathy.     Left upper body: No axillary adenopathy.  Skin:    General: Skin is warm and dry.  Neurological:     General: No focal deficit present.     Mental Status: She is alert and oriented to person, place, and time. Mental status is at baseline.     Gait: Gait normal.  Psychiatric:        Attention and Perception: Attention and perception normal.        Mood and Affect: Mood and affect normal.        Speech: Speech normal.        Behavior: Behavior normal. Behavior is cooperative.        Thought Content: Thought content normal.        Cognition and Memory: Cognition and memory normal.        Judgment: Judgment normal.     Assessment  Plan  Annual physical exam -  Fasting labs today sch fasting labs  Had flu shotutd  Tdap had 10/11/10  Hep B immune  covid 19 2/2   Continue exercise and healthy diet choices Pap 12/22/17 neg pap neg HPV  [facility] [place] [facility] negative -02/28/2019 negative   Colonoscopy age 44  -- dermatology skin check tbse and patch test propylene glycol vs othersaw 2019 negativewill f/u prn with dermatology  -[address] 04/2020 app sch  Of note sporadic cycle like perimenopausal  Bradycardia  Pt w/o sx's likely due to strenuous cardio   Provider: Dr. Olivia Mackie McLean-Scocuzza-Internal Medicine

## 2020-02-09 LAB — URINALYSIS, ROUTINE W REFLEX MICROSCOPIC
Bacteria, UA: NONE SEEN /HPF
Bilirubin Urine: NEGATIVE
Glucose, UA: NEGATIVE
Hgb urine dipstick: NEGATIVE
Hyaline Cast: NONE SEEN /LPF
Ketones, ur: NEGATIVE
Leukocytes,Ua: NEGATIVE
Nitrite: NEGATIVE
RBC / HPF: NONE SEEN /HPF (ref 0–2)
Specific Gravity, Urine: 1.024 (ref 1.001–1.03)
WBC, UA: NONE SEEN /HPF (ref 0–5)
pH: 6 (ref 5.0–8.0)

## 2020-02-12 ENCOUNTER — Other Ambulatory Visit: Payer: Self-pay | Admitting: Internal Medicine

## 2020-02-12 DIAGNOSIS — R809 Proteinuria, unspecified: Secondary | ICD-10-CM

## 2020-02-16 ENCOUNTER — Other Ambulatory Visit: Payer: 59

## 2020-02-16 ENCOUNTER — Other Ambulatory Visit: Payer: Self-pay

## 2020-02-16 DIAGNOSIS — R809 Proteinuria, unspecified: Secondary | ICD-10-CM

## 2020-02-17 LAB — MICROALBUMIN / CREATININE URINE RATIO
Creatinine, Urine: 166 mg/dL (ref 20–275)
Microalb Creat Ratio: 192 mcg/mg creat — ABNORMAL HIGH (ref <30)
Microalb, Ur: 31.8 mg/dL

## 2020-02-28 ENCOUNTER — Telehealth: Payer: Self-pay | Admitting: Internal Medicine

## 2020-02-28 NOTE — Telephone Encounter (Signed)
Pt wants to know if she needs lab work? Please advise

## 2020-02-28 NOTE — Telephone Encounter (Signed)
-----  Message from Delorise Jackson, MD sent at 02/27/2020 11:40 PM EDT ----- Heavy exercise or position changes I.e standing can cause protein in your urine but I would like to work this up further if the patient is agreeable?  I would like to do further labs and if these labs cant figure it out I would consider kidney doctor consult for protein in her urine   Is she agreeable to 1st even further labs and then to consider a kidney doctor referral if by the labs I am unable to figure out the cause of protein in her urine?   Labs would be to work up for lupus, bone disorders I.e multiple myeloma, UTI (so urine test again)   We also could consider a kidney ultrasound to look at the size, shape structure of her kidneys   Please let me know?

## 2020-02-28 NOTE — Telephone Encounter (Signed)
Patient informed and verbalized understanding.  She is agreeable to further labs for now. Scheduled for tomorrow morning 02/29/20 at 9:45 am.

## 2020-02-29 ENCOUNTER — Other Ambulatory Visit: Payer: Self-pay

## 2020-02-29 ENCOUNTER — Other Ambulatory Visit: Payer: Self-pay | Admitting: Internal Medicine

## 2020-02-29 ENCOUNTER — Other Ambulatory Visit (INDEPENDENT_AMBULATORY_CARE_PROVIDER_SITE_OTHER): Payer: 59

## 2020-02-29 DIAGNOSIS — R801 Persistent proteinuria, unspecified: Secondary | ICD-10-CM

## 2020-02-29 NOTE — Telephone Encounter (Signed)
Labs in

## 2020-02-29 NOTE — Telephone Encounter (Signed)
Noted, I do apologize.

## 2020-02-29 NOTE — Telephone Encounter (Signed)
Pt was added to the lab schedule this morning with no orders in. [place] BEFORE THEY ARRIVE

## 2020-03-01 LAB — URINE CULTURE
MICRO NUMBER:: 10675502
Result:: NO GROWTH
SPECIMEN QUALITY:: ADEQUATE

## 2020-03-01 LAB — PROTEIN ELECTROPHORESIS, URINE REFLEX
Albumin ELP, Urine: 72.1 %
Alpha-1-Globulin, U: 2.9 %
Alpha-2-Globulin, U: 5.2 %
Beta Globulin, U: 12 %
Gamma Globulin, U: 7.7 %
Protein, Ur: 23.3 mg/dL

## 2020-03-05 ENCOUNTER — Other Ambulatory Visit: Payer: Self-pay | Admitting: Internal Medicine

## 2020-03-05 DIAGNOSIS — R768 Other specified abnormal immunological findings in serum: Secondary | ICD-10-CM

## 2020-03-05 DIAGNOSIS — R809 Proteinuria, unspecified: Secondary | ICD-10-CM

## 2020-03-06 ENCOUNTER — Telehealth: Payer: Self-pay | Admitting: Internal Medicine

## 2020-03-06 ENCOUNTER — Encounter: Payer: Self-pay | Admitting: Internal Medicine

## 2020-03-06 DIAGNOSIS — R809 Proteinuria, unspecified: Secondary | ICD-10-CM | POA: Insufficient documentation

## 2020-03-06 DIAGNOSIS — R768 Other specified abnormal immunological findings in serum: Secondary | ICD-10-CM | POA: Insufficient documentation

## 2020-03-06 HISTORY — DX: Other specified abnormal immunological findings in serum: R76.8

## 2020-03-06 LAB — PROTEIN ELECTROPHORESIS, SERUM
A/G Ratio: 1.5 (ref 0.7–1.7)
Albumin ELP: 5.3 g/dL — ABNORMAL HIGH (ref 2.9–4.4)
Alpha 1: 0.2 g/dL (ref 0.0–0.4)
Alpha 2: 0.7 g/dL (ref 0.4–1.0)
Beta: 1.1 g/dL (ref 0.7–1.3)
Gamma Globulin: 1.4 g/dL (ref 0.4–1.8)
Globulin, Total: 3.5 g/dL (ref 2.2–3.9)
Total Protein: 8.8 g/dL — ABNORMAL HIGH (ref 6.0–8.5)

## 2020-03-06 LAB — ANA W/REFLEX: ANA Titer 1: NEGATIVE

## 2020-03-06 LAB — SPECIMEN STATUS REPORT

## 2020-03-06 LAB — ANA: Anti Nuclear Antibody (ANA): POSITIVE — AB

## 2020-03-06 NOTE — Telephone Encounter (Signed)
Faxed [facility] request patient report to [facility]. 03-06-20

## 2020-03-11 DIAGNOSIS — R809 Proteinuria, unspecified: Secondary | ICD-10-CM | POA: Insufficient documentation

## 2020-03-11 NOTE — Addendum Note (Signed)
Addended by: Orland Mustard on: 03/11/2020 06:59 PM   Modules accepted: Orders

## 2020-06-26 LAB — HM MAMMOGRAPHY

## 2020-06-28 ENCOUNTER — Encounter: Payer: Self-pay | Admitting: Internal Medicine

## 2020-07-02 ENCOUNTER — Encounter: Payer: Self-pay | Admitting: Internal Medicine

## 2020-07-18 ENCOUNTER — Other Ambulatory Visit: Payer: Self-pay

## 2020-07-18 ENCOUNTER — Ambulatory Visit
Admission: RE | Admit: 2020-07-18 | Discharge: 2020-07-18 | Disposition: A | Discharge status: Home / Self Care | Payer: 59 | Source: Ambulatory Visit | Attending: Emergency Medicine | Admitting: Emergency Medicine

## 2020-07-18 VITALS — BP 114/76 | HR 88 | Temp 97.9°F | Resp 15

## 2020-07-18 DIAGNOSIS — J069 Acute upper respiratory infection, unspecified: Secondary | ICD-10-CM

## 2020-07-18 DIAGNOSIS — H6693 Otitis media, unspecified, bilateral: Secondary | ICD-10-CM | POA: Diagnosis not present

## 2020-07-18 MED ORDER — AZITHROMYCIN 250 MG PO TABS
250.0000 mg | ORAL_TABLET | Freq: Every day | ORAL | 0 refills | Status: DC
Start: 2020-07-18 — End: 2021-02-07

## 2020-07-18 NOTE — ED Triage Notes (Signed)
Patient c/o nasal congestion and LFT ear pain x 2 days.   Patient endorsed LFT sided ear fullness and ringing.   Patient endorses more fullness in LFT ear after using "nettey pot".   Patient stated she's used OTC cough and cold medication w/ no relief of symptoms.

## 2020-07-18 NOTE — Discharge Instructions (Signed)
Take the antibiotic as directed.  Follow up with your primary care provider if your symptoms are not improving.     

## 2020-07-18 NOTE — ED Provider Notes (Signed)
[facility]    CSN: [id number] Arrival date & time: 07/18/20  1059      History   Chief Complaint Chief Complaint  Patient presents with  . Otalgia  . Nasal Congestion    HPI Erika [name] is a 44 y.o. female.   Patient presents with 2-day history of left ear pain and nasal congestion.  She also reports a mild nonproductive cough.  She attempted treatment at home with a Nettie pot and states her ear has felt "full" since then.  She denies fever, chills, sore throat, shortness of breath, vomiting, diarrhea, or other symptoms.  Other treatment attempted at home with NyQuil.  Her medical history includes seasonal allergies.  The history is provided by the patient and medical records.    Past Medical History:  Diagnosis Date  . Allergy     Patient Active Problem List   Diagnosis Date Noted  . Microalbuminuria 03/11/2020  . Positive ANA (antinuclear antibody) 03/06/2020  . Proteinuria 03/06/2020  . Bradycardia 02/08/2020  . Annual physical exam 02/01/2019  . Hives 02/01/2019  . Perimenopausal 02/01/2019    Past Surgical History:  Procedure Laterality Date  . DILATION AND CURETTAGE OF UTERUS    . WISDOM TOOTH EXTRACTION      OB History   No obstetric history on file.      Home Medications    Prior to Admission medications   Medication Sig Start Date End Date Taking? Authorizing Provider  azithromycin (ZITHROMAX) 250 MG tablet Take 1 tablet (250 mg total) by mouth daily. Take first 2 tablets together, then 1 every day until finished. 07/18/20   Sharion Balloon, NP    Family History Family History  Problem Relation Age of Onset  . Diabetes Maternal Aunt   . Ovarian cancer Maternal Grandmother   . Prostate cancer Maternal Grandfather   . ADD / ADHD Daughter        31 y.o as of 02/08/20    Social History Social History   Tobacco Use  . Smoking status: Never Smoker  . Smokeless tobacco: Never Used  Substance Use Topics  . Alcohol use: Yes     Alcohol/week: 0.0 - 1.0 standard drinks  . Drug use: No     Allergies   Flonase [fluticasone] and Penicillins   Review of Systems Review of Systems  Constitutional: Negative for chills and fever.  HENT: Positive for congestion and ear pain. Negative for sore throat.   Eyes: Negative for pain and visual disturbance.  Respiratory: Negative for cough and shortness of breath.   Cardiovascular: Negative for chest pain and palpitations.  Gastrointestinal: Negative for abdominal pain, diarrhea and vomiting.  Genitourinary: Negative for dysuria and hematuria.  Musculoskeletal: Negative for arthralgias and back pain.  Skin: Negative for color change and rash.  Neurological: Negative for seizures and syncope.  All other systems reviewed and are negative.    Physical Exam Triage Vital Signs ED Triage Vitals  Enc Vitals Group     BP 07/18/20 1123 114/76     Pulse Rate 07/18/20 1123 88     Resp 07/18/20 1123 15     Temp 07/18/20 1123 97.9 F (36.6 C)     Temp Source 07/18/20 1123 Oral     SpO2 07/18/20 1123 98 %     Weight --      Height --      Head Circumference --      Peak Flow --      Pain Score  07/18/20 1126 3     Pain Loc --      Pain Edu? --      Excl. in [place]? --    No data found.  Updated Vital Signs BP 114/76 (BP Location: Right Arm)   Pulse 88   Temp 97.9 F (36.6 C) (Oral)   Resp 15   LMP  (LMP Unknown)   SpO2 98%   Visual Acuity Right Eye Distance:   Left Eye Distance:   Bilateral Distance:    Right Eye Near:   Left Eye Near:    Bilateral Near:     Physical Exam Vitals and nursing note reviewed.  Constitutional:      General: She is not in acute distress.    Appearance: She is well-developed.  HENT:     Head: Normocephalic and atraumatic.     Right Ear: Tympanic membrane is erythematous.     Left Ear: Tympanic membrane is erythematous.     Nose: Congestion present.     Mouth/Throat:     Mouth: Mucous membranes are moist.     Pharynx:  Oropharynx is clear.  Eyes:     Conjunctiva/sclera: Conjunctivae normal.  Cardiovascular:     Rate and Rhythm: Normal rate and regular rhythm.     Heart sounds: Normal heart sounds.  Pulmonary:     Effort: Pulmonary effort is normal. No respiratory distress.     Breath sounds: Normal breath sounds.  Abdominal:     Palpations: Abdomen is soft.     Tenderness: There is no abdominal tenderness. There is no guarding or rebound.  Musculoskeletal:     Cervical back: Neck supple.  Skin:    General: Skin is warm and dry.     Findings: No rash.  Neurological:     General: No focal deficit present.     Mental Status: She is alert and oriented to person, place, and time.     Gait: Gait normal.  Psychiatric:        Mood and Affect: Mood normal.        Behavior: Behavior normal.      UC Treatments / Results  Labs (all labs ordered are listed, but only abnormal results are displayed) Labs Reviewed  NOVEL CORONAVIRUS, NAA    EKG   Radiology No results found.  Procedures Procedures (including critical care time)  Medications Ordered in UC Medications - No data to display  Initial Impression / Assessment and Plan / UC Course  I have reviewed the triage vital signs and the nursing notes.  Pertinent labs & imaging results that were available during my care of the patient were reviewed by me and considered in my medical decision making (see chart for details).   Bilateral otitis media.  Viral URI.  Treating with Zithromax.  COVID pending.  Instructed patient to self quarantine until the test result is back.  Discussed symptomatic treatment including Tylenol, rest, hydration.  Instructed patient to follow up with PCP if her symptoms are not improving  Patient agrees to plan of care.    Final Clinical Impressions(s) / UC Diagnoses   Final diagnoses:  Bilateral otitis media, unspecified otitis media type  Viral upper respiratory tract infection     Discharge Instructions       Take the antibiotic as directed.    Follow up with your primary care provider if your symptoms are not improving.       ED Prescriptions    Medication Sig Dispense Auth. Provider  azithromycin (ZITHROMAX) 250 MG tablet Take 1 tablet (250 mg total) by mouth daily. Take first 2 tablets together, then 1 every day until finished. 6 tablet Sharion Balloon, NP     PDMP not reviewed this encounter.   Sharion Balloon, NP 07/18/20 1150

## 2020-07-20 LAB — SARS-COV-2, NAA 2 DAY TAT

## 2020-07-20 LAB — NOVEL CORONAVIRUS, NAA: SARS-CoV-2, NAA: NOT DETECTED

## 2020-09-20 ENCOUNTER — Encounter: Payer: Self-pay | Admitting: Internal Medicine

## 2020-09-27 ENCOUNTER — Encounter: Payer: Self-pay | Admitting: Internal Medicine

## 2020-09-27 ENCOUNTER — Ambulatory Visit: Payer: 59 | Admitting: Internal Medicine

## 2020-09-27 ENCOUNTER — Other Ambulatory Visit: Payer: Self-pay

## 2020-09-27 VITALS — BP 110/78 | HR 65 | Temp 97.5°F | Ht 64.0 in | Wt 142.2 lb

## 2020-09-27 DIAGNOSIS — N393 Stress incontinence (female) (male): Secondary | ICD-10-CM

## 2020-09-27 DIAGNOSIS — R2231 Localized swelling, mass and lump, right upper limb: Secondary | ICD-10-CM

## 2020-09-27 DIAGNOSIS — R809 Proteinuria, unspecified: Secondary | ICD-10-CM | POA: Diagnosis not present

## 2020-09-27 NOTE — Progress Notes (Signed)
Chief Complaint  Patient presents with  . Mass    Pt noticed lump on R armpit last November; had a mammogram last Nov. Before noticing lump that came back normal. Has no previous history of lumps other than cyst(s) on head that have been evaluated by a dermatologist within past year.    F/u  1. 06/2020 noted right axillary mass ? Gland after booster or cold but not resolved not painful today possibly seen 1 year ago by derm  2. C/o urinating with jumping rope with exercise and stress urinary incontinence has 2 kids and wants to consider seeing specialty  3. Proteinuria f/u kidney specialist in 10/2020    Review of Systems  Constitutional: Negative for weight loss.  Genitourinary:       +incontinence  +right axillary mass   Musculoskeletal: Positive for joint pain.       Right shoulder pain    Past Medical History:  Diagnosis Date  . Allergy    Past Surgical History:  Procedure Laterality Date  . DILATION AND CURETTAGE OF UTERUS    . WISDOM TOOTH EXTRACTION     Family History  Problem Relation Age of Onset  . Diabetes Maternal Aunt   . Ovarian cancer Maternal Grandmother   . Prostate cancer Maternal Grandfather   . ADD / ADHD Daughter        46 y.o as of 02/08/20   Social History   Socioeconomic History  . Marital status: Married    Spouse name: Not on file  . Number of children: Not on file  . Years of education: Not on file  . Highest education level: Not on file  Occupational History  . Not on file  Tobacco Use  . Smoking status: Never Smoker  . Smokeless tobacco: Never Used  Substance and Sexual Activity  . Alcohol use: Yes    Alcohol/week: 0.0 - 1.0 standard drinks  . Drug use: No  . Sexual activity: Yes    Partners: Male  Other Topics Concern  . 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   Allergies  Allergen Reactions  . Flonase [Fluticasone] Rash  . Penicillins Other (See Comments)   Recent Results (from the past 2160 hour(s))  Novel Coronavirus, NAA ([facility])     Status: None   Collection Time: 07/18/20 11:50 AM   Specimen: Nasal Swab; Nasopharyngeal(NP) swabs in vial transport medium   Nasopharynge  Result Value Ref Range   SARS-CoV-2, NAA Not Detected Not Detected    Comment: This nucleic acid amplification test was developed and its performance characteristics determined by [employer]. Nucleic acid amplification tests include RT-PCR and TMA. This test has not been FDA cleared or approved. This test has been authorized by FDA under an Emergency Use Authorization (EUA). This test is only authorized for the duration of time the declaration that circumstances exist justifying the authorization of the emergency use of in vitro diagnostic tests for detection of SARS-CoV-2  virus and/or diagnosis of COVID-19 infection under section 564(b)(1) of the Act, 21 U.S.C. 086PYP-9(J) (1), unless the authorization is terminated or revoked sooner. When diagnostic testing is negative, the possibility of a false negative result should be considered in the context of a patient's recent exposures and the presence of clinical signs and symptoms consistent with COVID-19. An individual without symptoms of COVID-19 and who is not shedding SARS-CoV-2 virus wo uld expect to have a negative (not detected) result in this assay.   SARS-COV-2, NAA 2 DAY TAT     Status: None   Collection Time: 07/18/20 11:50 AM   Nasopharynge  Result Value Ref Range   SARS-CoV-2,  NAA 2 DAY TAT Performed    Objective  Body mass index is 24.42 kg/m. Wt Readings from Last 3 Encounters:  09/27/20 142 lb 4 oz (64.5 kg)  02/08/20 139 lb (63 kg)  02/01/19 134 lb 12.8 oz (61.1 kg)   Temp Readings from Last 3 Encounters:  09/27/20 (!) 97.5 F (36.4 C)  07/18/20 97.9 F (36.6 C) (Oral)  02/08/20 (!) 97.2 F (36.2 C) (Temporal)   BP Readings from Last 3 Encounters:  09/27/20 110/78  07/18/20 114/76  02/08/20 118/80   Pulse Readings from Last 3 Encounters:  09/27/20 65  07/18/20 88  02/08/20 (!) 50    Physical Exam Vitals and nursing note reviewed.  Constitutional:      Appearance: Normal appearance. She is well-developed and well-groomed.  HENT:     Head: Normocephalic and atraumatic.  Eyes:     Conjunctiva/sclera: Conjunctivae normal.     Pupils: Pupils are equal, round, and reactive to light.  Cardiovascular:     Rate and Rhythm: Normal rate and regular rhythm.     Heart sounds: Normal heart sounds. No murmur heard.   Pulmonary:     Effort: Pulmonary effort is normal.     Breath sounds: Normal breath sounds.  Chest:  Breasts: Breasts are symmetrical.     Right: Normal. Axillary adenopathy present.      Comments: 1-1.5 axillary mass  Lymphadenopathy:     Upper Body:     Right upper body: Axillary adenopathy present.  Skin:    General: Skin is warm and dry.  Neurological:     General: No focal deficit present.     Mental Status: She is alert and oriented to person, place, and time. Mental status is at baseline.     Gait: Gait normal.  Psychiatric:        Attention and Perception: Attention and perception normal.        Mood and Affect: Mood and affect normal.        Speech: Speech normal.        Behavior: Behavior normal. Behavior is cooperative.        Thought Content: Thought content normal.        Cognition and Memory: Cognition and memory normal.        Judgment: Judgment normal.     Assessment  Plan  Axillary mass, right -  Plan: US BREAST LTD UNI RIGHT INC AXILLA  Stress incontinence of urine - Plan: Ambulatory referral to Physical Therapy   Dr. Abbott Pao PT bladder/stress incontinence   HM Fasting labs today sch fasting labs Had flu shotutd  Tdap had 10/11/10  Hep B immune covid 19 2/2   Continue exercise and healthy diet choices Pap 12/22/17 neg pap neg HPV  [facility] [place] [facility] negative -02/28/2019 negative  Colonoscopy age 80  -- dermatology skin check tbse and patch test propylene glycol vs othersaw 2019 negativewill f/u prn with dermatology  -[address] 04/2020 app sch  Of note sporadic cycle like perimenopausal  Bradycardia  Pt w/o sx's likely due to strenuous cardio  [facility] f/u 10/2020 proteinuria   Provider: Dr. Olivia Mackie McLean-Scocuzza-Internal Medicine

## 2020-09-27 NOTE — Patient Instructions (Addendum)
Stress urinary incontinence -[facility] or [facility]  Dr. Jerl Mina PT [facility] [place]  [phone]  Urinary Incontinence  Urinary incontinence refers to a condition in which a person is unable to control where and when to pass urine. A person with this condition will urinate when he or she does not mean to (involuntarily). What are the causes? This condition may be caused by:  Medicines.  Infections.  Constipation.  Overactive bladder muscles.  Weak bladder muscles.  Weak pelvic floor muscles. These muscles provide support for the bladder, intestine, and, in women, the uterus.  Enlarged prostate in men. The prostate is a gland near the bladder. When it gets too big, it can pinch the urethra. With the urethra blocked, the bladder can weaken and lose the ability to empty properly.  Surgery.  Emotional factors, such as anxiety, stress, or post-traumatic stress disorder (PTSD).  Pelvic organ prolapse. This happens in women when organs shift out of place and into the vagina. This shift can prevent the bladder and urethra from working properly. What increases the risk? The following factors may make you more likely to develop this condition:  Older age.  Obesity and physical inactivity.  Pregnancy and childbirth.  Menopause.  Diseases that affect the nerves or spinal cord (neurological diseases).  Long-term (chronic) coughing. This can increase pressure on the bladder and pelvic floor muscles. What are the signs or symptoms? Symptoms may vary depending on the type of urinary incontinence you have. They include:  A sudden urge to urinate, but passing urine involuntarily before you can get to a bathroom (urge incontinence).  Suddenly passing urine with any activity that forces urine to pass, such as coughing, laughing, exercise, or sneezing (stress incontinence).  Needing to urinate often, but urinating only a small amount, or  constantly dribbling urine (overflow incontinence).  Urinating because you cannot get to the bathroom in time due to a physical disability, such as arthritis or injury, or communication and thinking problems, such as Alzheimer disease (functional incontinence). How is this diagnosed? This condition may be diagnosed based on:  Your medical history.  A physical exam.  Tests, such as: ? Urine tests. ? X-rays of your kidney and bladder. ? Ultrasound. ? CT scan. ? Cystoscopy. In this procedure, a health care provider inserts a tube with a light and camera (cystoscope) through the urethra and into the bladder in order to check for problems. ? Urodynamic testing. These tests assess how well the bladder, urethra, and sphincter can store and release urine. There are different types of urodynamic tests, and they vary depending on what the test is measuring. To help diagnose your condition, your health care provider may recommend that you keep a log of when you urinate and how much you urinate. How is this treated? Treatment for this condition depends on the type of incontinence that you have and its cause. Treatment may include:  Lifestyle changes, such as: ? Quitting smoking. ? Maintaining a healthy weight. ? Staying active. Try to get 150 minutes of moderate-intensity exercise every week. Ask your health care provider which activities are safe for you. ? Eating a healthy diet.  Avoid high-fat foods, like fried foods.  Avoid refined carbohydrates like white bread and white rice.  Limit how much alcohol and caffeine you drink.  Increase your fiber intake. Foods such as fresh fruits, vegetables, beans, and whole grains are healthy sources of fiber.  Pelvic floor muscle exercises.  Bladder training, such as lengthening the amount of time between bathroom breaks, or using the bathroom at regular intervals.  Using techniques to suppress bladder urges. This can include distraction techniques  or controlled breathing exercises.  Medicines to relax the bladder muscles and prevent bladder spasms.  Medicines to help slow or prevent the growth of a man's prostate.  Botox injections. These can help relax the bladder muscles.  Using pulses of electricity to help change bladder reflexes (electrical nerve stimulation).  For women, using a medical device to prevent urine leaks. This is a small, tampon-like, disposable device that is inserted into the urethra.  Injecting collagen or carbon beads (bulking agents) into the urinary sphincter. These can help thicken tissue and close the bladder opening.  Surgery. Follow these instructions at home: Lifestyle  Limit alcohol and caffeine. These can fill your bladder quickly and irritate it.  Keep yourself clean to help prevent odors and skin damage. Ask your doctor about special skin creams and cleansers that can protect the skin from urine.  Consider wearing pads or adult diapers. Make sure to change them regularly, and always change them right after experiencing incontinence. General instructions  Take over-the-counter and prescription medicines only as told by your health care provider.  Use the bathroom about every 3-4 hours, even if you do not feel the need to urinate. Try to empty your bladder completely every time. After urinating, wait a minute. Then try to urinate again.  Make sure you are in a relaxed position while urinating.  If your incontinence is caused by nerve problems, keep a log of the medicines you take and the times you go to the bathroom.  Keep all follow-up visits as told by your health care provider. This is important. Contact a health care provider if:  You have pain that gets worse.  Your incontinence gets worse. Get help right away if:  You have a fever or chills.  You are unable to urinate.  You have redness in your groin area or down your legs. Summary  Urinary incontinence refers to a condition  in which a person is unable to control where and when to pass urine.  This condition may be caused by medicines, infection, weak bladder muscles, weak pelvic floor muscles, enlargement of the prostate (in men), or surgery.  The following factors increase your risk for developing this condition: older age, obesity, pregnancy and childbirth, menopause, neurological diseases, and chronic coughing.  There are several types of urinary incontinence. They include urge incontinence, stress incontinence, overflow incontinence, and functional incontinence.  This condition is usually treated first with lifestyle and behavioral changes, such as quitting smoking, eating a healthier diet, and doing regular pelvic floor exercises. Other treatment options include medicines, bulking agents, medical devices, electrical nerve stimulation, or surgery. This information is not intended to replace advice given to you by your health care provider. Make sure you discuss any questions you have with your health care provider. Document Revised: 08/21/2017 Document Reviewed: 11/20/2016 Elsevier Patient Education  [place].  Kegel Exercises  Kegel exercises can help strengthen your pelvic floor muscles. The pelvic floor is a group of muscles that support your rectum, small intestine, and bladder. In females, pelvic floor muscles also help support the womb (uterus). These muscles help you control the flow of urine and stool. Kegel exercises are painless and simple, and they do not require any equipment. Your provider may suggest Kegel exercises to:  Improve bladder and bowel control.  Improve  sexual response.  Improve weak pelvic floor muscles after surgery to remove the uterus (hysterectomy) or pregnancy (females).  Improve weak pelvic floor muscles after prostate gland removal or surgery (males). Kegel exercises involve squeezing your pelvic floor muscles, which are the same muscles you squeeze when you try to  stop the flow of urine or keep from passing gas. The exercises can be done while sitting, standing, or lying down, but it is best to vary your position. Exercises How to do Kegel exercises: 1. Squeeze your pelvic floor muscles tight. You should feel a tight lift in your rectal area. If you are a female, you should also feel a tightness in your vaginal area. Keep your stomach, buttocks, and legs relaxed. 2. Hold the muscles tight for up to 10 seconds. 3. Breathe normally. 4. Relax your muscles. 5. Repeat as told by your health care provider. Repeat this exercise daily as told by your health care provider. Continue to do this exercise for at least 4-6 weeks, or for as long as told by your health care provider. You may be referred to a physical therapist who can help you learn more about how to do Kegel exercises. Depending on your condition, your health care provider may recommend:  Varying how long you squeeze your muscles.  Doing several sets of exercises every day.  Doing exercises for several weeks.  Making Kegel exercises a part of your regular exercise routine. This information is not intended to replace advice given to you by your health care provider. Make sure you discuss any questions you have with your health care provider. Document Revised: 12/16/2019 Document Reviewed: 03/31/2018 Elsevier Patient Education  [place].

## 2020-10-02 ENCOUNTER — Telehealth: Payer: Self-pay | Admitting: Internal Medicine

## 2020-10-02 NOTE — Telephone Encounter (Signed)
10/02/20 [place] right underarm mass is 2.0 x 0.5 x 1.7 cm they think its a fatty tumor=lipoma They rec f/u US in 3 months ,is she aggreable?  Or if she wants surgery to remove/cut mass out in office this is also an option  What would she like to do [place] repeat in 3 months or surgery consult?

## 2020-10-04 ENCOUNTER — Encounter: Payer: Self-pay | Admitting: Internal Medicine

## 2020-10-04 ENCOUNTER — Other Ambulatory Visit: Payer: Self-pay | Admitting: Internal Medicine

## 2020-10-04 DIAGNOSIS — R2231 Localized swelling, mass and lump, right upper limb: Secondary | ICD-10-CM

## 2020-10-04 NOTE — Telephone Encounter (Signed)
Patient informed and verbalized understanding.  She would like to do the repeat US in 3 months.

## 2020-10-04 NOTE — Telephone Encounter (Signed)
Please sch repeat right axillary [place] in 3 months 12/30/20 [facility] in [place]   Thanks [place]

## 2020-10-05 NOTE — Telephone Encounter (Signed)
Received order was faxed on 02/03 and pt was notified. YW!

## 2021-01-07 ENCOUNTER — Telehealth: Payer: Self-pay | Admitting: Internal Medicine

## 2021-01-07 NOTE — Telephone Encounter (Signed)
Right underarm mass size from 2.0 x 1.7 x 0.5 cm to reduced size 1.5 x 1.0 x 0.4 cm  Per report this appears benign/not cancerous and they rec. Mammogram yearly  Due 06/26/21 screening bilateral mammogram [facility]   If she is still concerned about right underarm mass consider surgery consult for biopsy let me know if this is desired .

## 2021-01-07 NOTE — Telephone Encounter (Signed)
Patient informed and verbalized understanding.  She would like to hold off for referral for now

## 2021-01-08 ENCOUNTER — Ambulatory Visit: Payer: 59

## 2021-01-15 ENCOUNTER — Ambulatory Visit: Payer: 59

## 2021-01-22 ENCOUNTER — Other Ambulatory Visit: Payer: Self-pay

## 2021-01-22 ENCOUNTER — Ambulatory Visit: Payer: 59 | Attending: Internal Medicine

## 2021-01-22 DIAGNOSIS — R278 Other lack of coordination: Secondary | ICD-10-CM | POA: Insufficient documentation

## 2021-01-22 DIAGNOSIS — N393 Stress incontinence (female) (male): Secondary | ICD-10-CM | POA: Insufficient documentation

## 2021-01-22 DIAGNOSIS — M6281 Muscle weakness (generalized): Secondary | ICD-10-CM | POA: Insufficient documentation

## 2021-01-22 NOTE — Therapy (Deleted)
[place] MAIN [facility] SERVICES [address] [place], [place], [address] Phone: [phone]   Fax:  [phone]  Physical Therapy Evaluation  Patient Details  Name: Erika [name] MRN: [id number] Date of Birth: May 11, 1976 Referring Provider (PT): McLean-Scocuzza, Nino Glow, MD   Encounter Date: 01/22/2021    Past Medical History:  Diagnosis Date  . Allergy    seasonal    Past Surgical History:  Procedure Laterality Date  . DILATION AND CURETTAGE OF UTERUS    . WISDOM TOOTH EXTRACTION      There were no vitals filed for this visit.    Subjective Assessment - 01/22/21 1019    Subjective Pt reported stress urinary incontinence (SUI) occurs with sneezing, coughing, repeated jump rope at crossfit (began crossfit about two years ago). SUI began postpartum (kids are 38 and 53 years old). Both deliveries were vaginal births, pt reported tearing and believes it was grade II tear with stitches. Urinary: pt wears pads 2/2 SUI, 1-2 pads/week or more if she is coughing/sneezing more. Bowel: pt has intermittent constipation and hemorroids, every other day, describes 3-4 bowel movement on chart. Core stability: back has resolved with incr. fitness. Sexual function: no pain with intercourse or speculum at [facility] exam. Pt denied fall on tailbone but states she fell and landed directly on feet, which caused incr. force on B hip joints-and has incr. tightness of hamstrings (proximal).    Pertinent History R axillary mass-benign and MD is following, perimenopause    Patient Stated Goals Improve leakage    Currently in Pain? No/denies              [facility] PT Assessment - 01/22/21 1017      Assessment   Medical Diagnosis SUI    Referring Provider (PT) McLean-Scocuzza, Nino Glow, MD    Onset Date/Surgical Date 09/27/20    Prior Therapy none for SUI      Precautions   Precautions None      Restrictions   Weight Bearing Restrictions No      Balance Screen    Has the patient fallen in the past 6 months No    Has the patient had a decrease in activity level because of a fear of falling?  No    Is the patient reluctant to leave their home because of a fear of falling?  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 Yes with perimenopause Unexplained weight loss? no Saddle anesthesia? no Unexplained changes in bowel or bladder changes? no  SUBJECTIVE  Patient reports:  See subjective  Precautions:  none  Social/Family/Vocational History:   See flowsheet  Recent Procedures/Tests/Findings:  none  Obstetrical History:  See  above  Gynecological History: See above  Urinary History: See above  Gastrointestinal History: See above Sexual activity/pain: See above    OBJECTIVE  Posture/Observations:  Sitting: crossed at ankles, occasionally crosses at knees and noted to slouch when working from home Standing:   Palpation/Segmental Motion/Joint Play:  Special tests:     Range of Motion/Flexibilty:  Spine: Hips:   Strength/MMT:  LE MMT  LE MMT Left Right  Hip flex:  (L2) /5 /5  Hip ext: /5 /5  Hip abd: /5 /5  Hip add: /5 /5  Hip IR /5 /5  Hip ER /5 /5     Abdominal:  Palpation: Diastasis:  Pelvic Floor External Exam: Introitus Appears:  Skin integrity:   Palpation: Cough: Prolapse visible?: Scar mobility: Through clothing: Ischial tuberosities: Palpation for pelvic floor contraction: Coccyx:  Internal Vaginal Exam: Strength (PERF):  Symmetry: Palpation: Prolapse:   Internal Rectal Exam: Strength (PERF): Symmetry: Palpation: Prolapse:   Gait Analysis:   Pelvic Floor Outcome Measures: ***  INTERVENTIONS THIS SESSION:   Total time:    Hep: Access Code: [email or web address] URL: [email or web address] Date: 01/22/2021 Prepared by: Geoffry Paradise  Exercises Clamshell - 1 x daily - 4 x weekly - 3 sets - 10 reps Diaphragmatic Breathing at 90/90 Supported - 3 x daily - 7 x weekly - 1 sets - 5 reps Seated Hamstring Stretch - 2-3 x daily - 7 x weekly - 1 sets - 3 reps        Objective measurements completed on examination: See above findings.                             Patient will benefit from skilled therapeutic intervention in order to improve the following deficits and impairments:     Visit Diagnosis: No diagnosis found.     Problem List Patient Active Problem List   Diagnosis Date Noted  . Axillary mass, right 10/04/2020  . Microalbuminuria 03/11/2020  . Positive ANA (antinuclear antibody) 03/06/2020  . Proteinuria 03/06/2020  . Bradycardia 02/08/2020  . Annual physical exam 02/01/2019  . Hives 02/01/2019  . Perimenopausal 02/01/2019    Erika [name] L 01/22/2021, 10:29 AM  [place] MAIN [facility] SERVICES [address] [place], [place], [address] Phone: [phone]   Fax:  [phone]  Name: Erika [name] MRN: [id number] Date of Birth: 14-Dec-1975

## 2021-01-22 NOTE — Therapy (Deleted)
[place] MAIN [facility] SERVICES [address] [place], [place], [address] Phone: [phone]   Fax:  [phone]  Physical Therapy Evaluation  Patient Details  Name: Erika [name] MRN: [id number] Date of Birth: 10-04-1975 Referring Provider (PT): McLean-Scocuzza, Nino Glow, MD   Encounter Date: 01/22/2021    Past Medical History:  Diagnosis Date  . Allergy    seasonal    Past Surgical History:  Procedure Laterality Date  . DILATION AND CURETTAGE OF UTERUS    . WISDOM TOOTH EXTRACTION      There were no vitals filed for this visit.    Subjective Assessment - 01/22/21 1019    Subjective Pt reported stress urinary incontinence (SUI) occurs with sneezing, coughing, repeated jump rope at crossfit (began crossfit about two years ago). SUI began postpartum (kids are 23 and 15 years old). Both deliveries were vaginal births, pt reported tearing and believes it was grade II tear with stitches. Urinary: pt wears pads 2/2 SUI, 1-2 pads/week or more if she is coughing/sneezing more. Bowel: pt has intermittent constipation and hemorroids, every other day, describes 3-4 bowel movement on chart. Core stability: back has resolved with incr. fitness. Sexual function: no pain with intercourse or speculum at [facility] exam. Pt denied fall on tailbone but states she fell and landed directly on feet, which caused incr. force on B hip joints-and has incr. tightness of hamstrings (proximal).    Pertinent History R axillary mass-benign and MD is following, perimenopause    Patient Stated Goals Improve leakage    Currently in Pain? No/denies              [facility] PT Assessment - 01/22/21 1017      Assessment   Medical Diagnosis SUI    Referring Provider (PT) McLean-Scocuzza, Nino Glow, MD    Onset Date/Surgical Date 09/27/20    Prior Therapy none for SUI      Precautions   Precautions None      Restrictions   Weight Bearing Restrictions No      Balance Screen    Has the patient fallen in the past 6 months No    Has the patient had a decrease in activity level because of a fear of falling?  No    Is the patient reluctant to leave their home because of a fear of falling?  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                             Patient will benefit from skilled therapeutic intervention in order to improve the following deficits and impairments:     Visit  Diagnosis: No diagnosis found.     Problem List Patient Active Problem List   Diagnosis Date Noted  . Axillary mass, right 10/04/2020  . Microalbuminuria 03/11/2020  . Positive ANA (antinuclear antibody) 03/06/2020  . Proteinuria 03/06/2020  . Bradycardia 02/08/2020  . Annual physical exam 02/01/2019  . Hives 02/01/2019  . Perimenopausal 02/01/2019    [name] 01/22/2021, 10:29 AM  [place] MAIN [facility] SERVICES [address] [place], [place], [address] Phone: [phone]   Fax:  [phone]  Name: Erika [name] MRN: [id number] Date of Birth: 1976-05-09

## 2021-01-22 NOTE — Therapy (Signed)
[place] MAIN [facility] SERVICES [address] [place], [place], [address] Phone: [phone]   Fax:  [phone]  Physical Therapy Evaluation  Patient Details  Name: Erika [name] MRN: [id number] Date of Birth: [birth year] Referring Provider (PT): McLean-Scocuzza, Nino Glow, MD   Encounter Date: 01/22/2021   PT End of Session - 01/22/21 1246    Visit Number 1    Number of Visits 10    Date for PT Re-Evaluation 04/22/21    Authorization Type UHC    PT Start Time 1010    PT Stop Time 1057    PT Time Calculation (min) 47 min    Activity Tolerance Patient tolerated treatment well    Behavior During Therapy [facility] for tasks assessed/performed           Past Medical History:  Diagnosis Date  . Allergy    seasonal    Past Surgical History:  Procedure Laterality Date  . DILATION AND CURETTAGE OF UTERUS    . WISDOM TOOTH EXTRACTION      There were no vitals filed for this visit.    Subjective Assessment - 01/22/21 1019    Subjective Pt reported stress urinary incontinence (SUI) occurs with sneezing, coughing, repeated jump rope at crossfit (began crossfit about two years ago). SUI began postpartum (kids are 16 and 72 years old). Both deliveries were vaginal births, pt reported tearing and believes it was grade II tear with stitches. Urinary: pt wears pads 2/2 SUI, 1-2 pads/week or more if she is coughing/sneezing more. Bowel: pt has intermittent constipation and hemorroids, every other day, describes 3-4 bowel movement on chart. Core stability: back has resolved with incr. fitness. Sexual function: no pain with intercourse or speculum at [facility] exam. Pt denied fall on tailbone but states she fell and landed directly on feet, which caused incr. force on B hip joints-and has incr. tightness of hamstrings (proximal).    Pertinent History R axillary mass-benign and MD is following, perimenopause    Patient Stated Goals Improve leakage    Currently in  Pain? No/denies              [facility] PT Assessment - 01/22/21 1017      Assessment   Medical Diagnosis SUI    Referring Provider (PT) McLean-Scocuzza, Nino Glow, MD    Onset Date/Surgical Date 09/27/20    Prior Therapy none for SUI      Precautions   Precautions None      Restrictions   Weight Bearing Restrictions No      Balance Screen   Has the patient fallen in the past 6 months No    Has the patient had a decrease in activity level because of a fear of falling?  No    Is the patient reluctant to leave their home because of a fear of falling?  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 Just perimenopause Unexplained weight loss? no Saddle anesthesia? no Unexplained changes in bowel or bladder changes? no  SUBJECTIVE  Patient reports:  See subjective   OBJECTIVE  Posture/Observations:  Sitting: crossed at ankles and occasionally at knees Standing: incr. Lx spine lordosis     Range of Motion/Flexibilty: no pain Spine: WNL Hips: WNL   Strength/MMT:  LE MMT  LE MMT Left Right  Hip flex:  (L2) /5 /5  Hip ext: /5 /5  Hip abd: 4/5 3+/5  Hip add: 4/5 4/5  Hip IR /5 /5  Hip ER /5 /5     Abdominal:  Palpation: no tenderness Diastasis: 1" superior: 2.5 fingers, 1" inferior: 3 fingers width  Pelvic Floor External  Exam:  Through clothing: Ischial tuberosities: slight tenderness along medial edge or R IT Palpation for pelvic floor contraction: first contraction glute compensation noted, then pt able to isolate pelvic floor and contract and lift up Coccyx: slight tenderness on R side of coccyx      INTERVENTIONS THIS SESSION:      HEP: Access Code: [email or web address] URL: [email or web address] Date: 01/22/2021 Prepared by: Geoffry Paradise  Exercises Clamshell - 1 x daily - 4 x weekly - 3 sets - 10 reps (with red band on LLE and no band on RLE) Diaphragmatic Breathing at 90/90 Supported - 3 x daily - 7 x weekly - 1 sets - 5 reps Seated Hamstring Stretch - 2-3 x daily - 7 x weekly - 1 sets - 3 reps          Objective measurements completed on examination: See above findings.               PT Education - 01/22/21 1245    Education Details PT educated pt on POC, frequency, duration. PT provided pt with HEP and explained role of pelvic floor.    Person(s) Educated Patient    Methods Explanation;Demonstration;Verbal cues;Handout;Tactile cues    Comprehension Returned demonstration;Verbalized understanding            PT Short Term Goals - 01/22/21 1303      PT SHORT TERM GOAL #1   Title Pt will be IND in HEP to decr. incontinence and improved strength. TARGET DATE FOR ALL STGS: 02/26/21    Baseline NO HEP    Time 5    Period Weeks    Status New      PT SHORT TERM GOAL #2   Title Have pt complete FOTO and write goals.    Baseline 5    Period Weeks    Status New             PT Long Term Goals - 01/22/21 1304      PT LONG TERM GOAL #1   Title Pt will be IND in progressed HEP to improve strength, coordination and incontinence. TARGET DATE FOR ALL LTGS: 04/02/21    Baseline NO HEP    Time 10    Period Weeks    Status New      PT LONG TERM GOAL #2   Title Pt will report no leaking with coughing/sneezing to improve QOL.    Baseline 10    Period Weeks     Status New      PT LONG TERM GOAL #3   Title Pt will  report no leaking while working out at [employer] to improve QOL.    Time 10    Period Weeks    Status New                  Plan - 01/22/21 1246    Clinical Impression Statement Pt is a pleasant 45y/o female presenting to pelvic health PT for stress urinary incontinence (SUI). Pt's PMH is significant for the following: R axillary mass-benign and MD is following, perimenopause. The following impairments were noted upon exam: decr. strength, impaired flexibilty, postural dysfunction, incontinence reported and coordination impairments during pelvic floor contraction. Pt noted to have diastasis recti 1" superior to umbilicus (2.5 fingers width) and 1" inferior to unmilicus (3 fingers). Pt also noted to use chest muscles during breathing. Pt would benefit from skilled PT to improve incontinence, strength, and coordination during iADLs and ADLs.    Personal Factors and Comorbidities Comorbidity 2;Time since onset of injury/illness/exacerbation    Examination-Activity Limitations Locomotion Level;Lift;Carry;Continence    Examination-Participation Restrictions Other   gym   Stability/Clinical Decision Making Stable/Uncomplicated    Clinical Decision Making Low    Rehab Potential Good    PT Frequency 1x / week    PT Duration Other (comment)   10 weeks   PT Treatment/Interventions ADLs/Self Care Home Management;Stair training;Functional mobility training;Gait training;Therapeutic activities;Therapeutic exercise;Balance training;Neuromuscular re-education;Patient/family education;Joint Manipulations    PT Next Visit Plan Review HEP and progress as tolerated, begin deep core work for DR, palpate adductors and hamstrings for trigger points.    PT Home Exercise Plan Medbridge: [email or web address]    Consulted and Agree with Plan of Care Patient           Patient will benefit from skilled therapeutic intervention in order to improve the following  deficits and impairments:  Decreased balance,Postural dysfunction,Impaired flexibility,Decreased strength,Decreased coordination  Visit Diagnosis: Stress incontinence (female) (female)  Other lack of coordination  Muscle weakness (generalized)     Problem List Patient Active Problem List   Diagnosis Date Noted  . Axillary mass, right 10/04/2020  . Microalbuminuria 03/11/2020  . Positive ANA (antinuclear antibody) 03/06/2020  . Proteinuria 03/06/2020  . Bradycardia 02/08/2020  . Annual physical exam 02/01/2019  . Hives 02/01/2019  . Perimenopausal 02/01/2019    Talasia Saulter L 01/22/2021, 1:06 PM  [place] MAIN [facility] SERVICES [address] [place], [place], [address] Phone: [phone]   Fax:  [phone]  Name: Erika [name] MRN: [id number] Date of Birth: 16-Aug-1976  Geoffry Paradise, PT,DPT 01/22/21 1:10 PM Phone: [phone] Fax: [phone]

## 2021-01-29 ENCOUNTER — Ambulatory Visit: Payer: 59

## 2021-02-05 ENCOUNTER — Other Ambulatory Visit: Payer: Self-pay

## 2021-02-05 ENCOUNTER — Ambulatory Visit: Payer: 59 | Attending: Internal Medicine

## 2021-02-05 DIAGNOSIS — M6281 Muscle weakness (generalized): Secondary | ICD-10-CM | POA: Diagnosis present

## 2021-02-05 DIAGNOSIS — R278 Other lack of coordination: Secondary | ICD-10-CM

## 2021-02-05 DIAGNOSIS — N393 Stress incontinence (female) (male): Secondary | ICD-10-CM | POA: Diagnosis present

## 2021-02-05 NOTE — Therapy (Addendum)
[place] MAIN [facility] SERVICES [address] [place], [place], [address] Phone: [phone]   Fax:  [phone]  Physical Therapy Treatment  Patient Details  Name: Erika [name] MRN: [id number] Date of Birth: May 30, 1976 Referring Provider (PT): McLean-Scocuzza, Nino Glow, MD   Encounter Date: 02/05/2021   PT End of Session - 02/05/21 1236     Visit Number 2    Number of Visits 10    Date for PT Re-Evaluation 04/22/21    Authorization Type UHC    PT Start Time 1003    PT Stop Time 6962    PT Time Calculation (min) 55 min    Activity Tolerance Patient tolerated treatment well;No increased pain    Behavior During Therapy WFL for tasks assessed/performed             Past Medical History:  Diagnosis Date   Allergy    seasonal    Past Surgical History:  Procedure Laterality Date   DILATION AND CURETTAGE OF UTERUS     WISDOM TOOTH EXTRACTION      There were no vitals filed for this visit.   Subjective Assessment - 02/05/21 1007     Subjective Pt reported she had to travel to [place] for work last week, so she wasn't able to perform HEP as much-maybe 3 days in last two weeks. Pt denied changes since last visit. Pt incr. tightness in hamstrings (L>R).    Pertinent History R axillary mass-benign and MD is following, perimenopause    Patient Stated Goals Improve leakage    Currently in Pain? No/denies                    NMR:  Access Code: [email or web address] URL: [email or web address] Date: 02/05/2021 Prepared by: Geoffry Paradise  Exercises Clamshell - 1 x daily - 4 x weekly - 3 sets - 10 reps Diaphragmatic Breathing at 90/90 Supported - 3 x daily - 7 x weekly - 1 sets - 5 reps Seated Hamstring Stretch - 2-3 x daily - 7 x weekly - 1 sets - 3 reps Supine Pelvic Floor Contraction - 2 x daily - 7 x weekly - 1 sets - 10 reps, also, 10 sec. Hold x5 reps.  PT provided cues for proper technique during review of  previous HEP, as pt unable to perform consistently 2/2 work travel. Pt required moderate cues during pelvic floor contractions to reduce glute and adductor contractions. Pt had to perform breathing first and then pelvic floor contractions separately prior to performing together (exhale with contraction and inhale with relaxation of pelvic floor).   PT demonstrated supine double knee to chest and the toe touch (one LE at a time) with TrA activation vs. Crunches or V leg lifts. PT also demonstrated wall plank and wall side plank to modify crossfit workouts to reduce abdominal pressure. Pt returned demo.  FOTO: urinary problem score of 72            [facility] Adult PT Treatment/Exercise - 02/05/21 1231       Manual Therapy   Manual Therapy Soft tissue mobilization;Joint mobilization    Manual therapy comments Pt reported decr. tightness and hamstring "felt better" after manual therapy.    Joint Mobilization Hypomobility of coccyx noted during palpation, improved with sustained PA pressure with pt performing 5 pelvic floor contractions in prone position.    Soft tissue mobilization PT performed massage over B hamstrings with free up to decr. tension and improve flexibility. Pt reported L  hamstring pain reduced after massage. TTP at medical ischial tub. ceased after manual therapy at coccyx.                    PT Education - 02/05/21 1234     Education Details PT reviewed and added to HEP. PT also educated pt on diaphragm and pelvic floor relationship and the importance of coordinating breath (exhale) with contraction and inhale with relaxtion. PT also educated pt on modifying crossfit activities which incr. abdominal pressure and counteract PT exercises, such as situps, crunches, mountain climbers in plank, V leg lifts.    Person(s) Educated Patient    Methods Explanation;Demonstration;Verbal cues;Handout    Comprehension Returned demonstration;Verbalized understanding               PT Short Term Goals - 02/05/21 1243       PT SHORT TERM GOAL #1   Title Pt will be IND in HEP to decr. incontinence and improved strength. TARGET DATE FOR ALL STGS: 02/26/21    Baseline NO HEP    Time 5    Period Weeks    Status New      PT SHORT TERM GOAL #2   Title Have pt complete FOTO and write goals.    Baseline 5    Period Weeks    Status Achieved               PT Long Term Goals - 02/05/21 1243       PT LONG TERM GOAL #1   Title Pt will be IND in progressed HEP to improve strength, coordination and incontinence. TARGET DATE FOR ALL LTGS: 04/02/21    Baseline NO HEP    Time 10    Period Weeks    Status New      PT LONG TERM GOAL #2   Title Pt will report no leaking with coughing/sneezing to improve QOL.    Baseline 10    Period Weeks    Status New      PT LONG TERM GOAL #3   Title Pt will report no leaking while working out at [employer] to improve QOL.    Time 10    Period Weeks    Status New      PT LONG TERM GOAL #4   Title Pt will improve FOTO score to >/=77 to improve QOL.    Baseline 72 urinary problems    Time 10    Period Weeks    Status New                   Plan - 02/05/21 1237     Clinical Impression Statement Today's skilled session focused on manual therapy to improve mobility, reduce pain; coordination of diaphragm with pelvic floor contraction, and modifying crossfit activiites. Pt demonstrated progress as she reported reduced discomfort of L hamstring after manual therapy. Pt required moderate cues to reduce compensatory strategies during pelvic floor contraction, as adductors and glutes fired but was able to isolate after cueing. Significant abdominal coning noted during V leg lifts, therefore, PT modified activity. FOTO score: 72 (urinary problem). Pt continues to experience intermittent leakage, pain, and decr. strength and would continue to benefit from skilled PT to improve those impairments during all ADLs.    Personal  Factors and Comorbidities Comorbidity 2;Time since onset of injury/illness/exacerbation    Examination-Activity Limitations Locomotion Level;Lift;Carry;Continence    Examination-Participation Restrictions Other   gym   Stability/Clinical Decision Making Stable/Uncomplicated  Rehab Potential Good    PT Frequency 1x / week    PT Duration Other (comment)   10 weeks   PT Treatment/Interventions ADLs/Self Care Home Management;Stair training;Functional mobility training;Gait training;Therapeutic activities;Therapeutic exercise;Balance training;Neuromuscular re-education;Patient/family education;Joint Manipulations    PT Next Visit Plan Review HEP and progress as tolerated, continue begin deep core work for DR, palpate adductors and hamstrings for trigger points. Assess diaphragm for restrictions as needed. R SLS-genu recurvatum?, posture.    PT Home Exercise Plan Medbridge: [email or web address]    Consulted and Agree with Plan of Care Patient             Patient will benefit from skilled therapeutic intervention in order to improve the following deficits and impairments:  Decreased balance, Postural dysfunction, Impaired flexibility, Decreased strength, Decreased coordination  Visit Diagnosis: Stress incontinence (female) (female)  Muscle weakness (generalized)  Other lack of coordination     Problem List Patient Active Problem List   Diagnosis Date Noted   Axillary mass, right 10/04/2020   Microalbuminuria 03/11/2020   Positive ANA (antinuclear antibody) 03/06/2020   Proteinuria 03/06/2020   Bradycardia 02/08/2020   Annual physical exam 02/01/2019   Hives 02/01/2019   Perimenopausal 02/01/2019    Kenon Delashmit L 02/05/2021, 12:45 PM  [place] MAIN [facility] SERVICES [address] [place], [place], [address] Phone: [phone]   Fax:  [phone]  Name: Erika [name] MRN: [id number] Date of Birth: 1975/09/28   Geoffry Paradise,  PT,DPT 02/05/21 12:50 PM Phone: [phone] Fax: [phone]

## 2021-02-07 ENCOUNTER — Encounter: Payer: Self-pay | Admitting: Internal Medicine

## 2021-02-07 ENCOUNTER — Other Ambulatory Visit (HOSPITAL_COMMUNITY)
Admission: RE | Admit: 2021-02-07 | Discharge: 2021-02-07 | Disposition: A | Discharge status: Home / Self Care | Payer: 59 | Source: Ambulatory Visit | Attending: Internal Medicine | Admitting: Internal Medicine

## 2021-02-07 ENCOUNTER — Ambulatory Visit (INDEPENDENT_AMBULATORY_CARE_PROVIDER_SITE_OTHER): Payer: 59 | Admitting: Internal Medicine

## 2021-02-07 ENCOUNTER — Other Ambulatory Visit: Payer: Self-pay

## 2021-02-07 VITALS — BP 92/70 | HR 63 | Temp 97.6°F | Ht 64.92 in | Wt 139.6 lb

## 2021-02-07 DIAGNOSIS — Z0000 Encounter for general adult medical examination without abnormal findings: Secondary | ICD-10-CM | POA: Diagnosis not present

## 2021-02-07 DIAGNOSIS — E559 Vitamin D deficiency, unspecified: Secondary | ICD-10-CM

## 2021-02-07 DIAGNOSIS — E538 Deficiency of other specified B group vitamins: Secondary | ICD-10-CM

## 2021-02-07 DIAGNOSIS — N939 Abnormal uterine and vaginal bleeding, unspecified: Secondary | ICD-10-CM | POA: Diagnosis not present

## 2021-02-07 DIAGNOSIS — Z124 Encounter for screening for malignant neoplasm of cervix: Secondary | ICD-10-CM | POA: Insufficient documentation

## 2021-02-07 DIAGNOSIS — Z1389 Encounter for screening for other disorder: Secondary | ICD-10-CM

## 2021-02-07 DIAGNOSIS — Z23 Encounter for immunization: Secondary | ICD-10-CM

## 2021-02-07 DIAGNOSIS — Z1211 Encounter for screening for malignant neoplasm of colon: Secondary | ICD-10-CM | POA: Diagnosis not present

## 2021-02-07 DIAGNOSIS — N841 Polyp of cervix uteri: Secondary | ICD-10-CM | POA: Diagnosis not present

## 2021-02-07 DIAGNOSIS — Z1231 Encounter for screening mammogram for malignant neoplasm of breast: Secondary | ICD-10-CM

## 2021-02-07 DIAGNOSIS — Z13818 Encounter for screening for other digestive system disorders: Secondary | ICD-10-CM

## 2021-02-07 DIAGNOSIS — Z13220 Encounter for screening for lipoid disorders: Secondary | ICD-10-CM | POA: Diagnosis not present

## 2021-02-07 DIAGNOSIS — R2231 Localized swelling, mass and lump, right upper limb: Secondary | ICD-10-CM

## 2021-02-07 DIAGNOSIS — Z1329 Encounter for screening for other suspected endocrine disorder: Secondary | ICD-10-CM | POA: Diagnosis not present

## 2021-02-07 LAB — LIPID PANEL
Cholesterol: 165 mg/dL (ref 0–200)
HDL: 54 mg/dL (ref >39.00)
LDL Cholesterol: 96 mg/dL (ref 0–99)
NonHDL: 110.73
Total CHOL/HDL Ratio: 3
Triglycerides: 76 mg/dL (ref 0.0–149.0)
VLDL: 15.2 mg/dL (ref 0.0–40.0)

## 2021-02-07 LAB — CBC WITH DIFFERENTIAL/PLATELET
Basophils Absolute: 0 10*3/uL (ref 0.0–0.1)
Basophils Relative: 0.4 % (ref 0.0–3.0)
Eosinophils Absolute: 0.1 10*3/uL (ref 0.0–0.7)
Eosinophils Relative: 1.6 % (ref 0.0–5.0)
HCT: 39.7 % (ref 36.0–46.0)
Hemoglobin: 13.3 g/dL (ref 12.0–15.0)
Lymphocytes Relative: 31.7 % (ref 12.0–46.0)
Lymphs Abs: 2.3 10*3/uL (ref 0.7–4.0)
MCHC: 33.4 g/dL (ref 30.0–36.0)
MCV: 89.9 fl (ref 78.0–100.0)
Monocytes Absolute: 0.6 10*3/uL (ref 0.1–1.0)
Monocytes Relative: 8.8 % (ref 3.0–12.0)
Neutro Abs: 4.1 10*3/uL (ref 1.4–7.7)
Neutrophils Relative %: 57.5 % (ref 43.0–77.0)
Platelets: 350 10*3/uL (ref 150.0–400.0)
RBC: 4.42 Mil/uL (ref 3.87–5.11)
RDW: 13.5 % (ref 11.5–15.5)
WBC: 7.1 10*3/uL (ref 4.0–10.5)

## 2021-02-07 LAB — COMPREHENSIVE METABOLIC PANEL
ALT: 12 U/L (ref 0–35)
AST: 19 U/L (ref 0–37)
Albumin: 4.3 g/dL (ref 3.5–5.2)
Alkaline Phosphatase: 92 U/L (ref 39–117)
BUN: 17 mg/dL (ref 6–23)
CO2: 29 mEq/L (ref 19–32)
Calcium: 9.4 mg/dL (ref 8.4–10.5)
Chloride: 102 mEq/L (ref 96–112)
Creatinine, Ser: 0.99 mg/dL (ref 0.40–1.20)
GFR: 68.8 mL/min (ref >60.00)
Glucose, Bld: 83 mg/dL (ref 70–99)
Potassium: 4.3 mEq/L (ref 3.5–5.1)
Sodium: 138 mEq/L (ref 135–145)
Total Bilirubin: 0.6 mg/dL (ref 0.2–1.2)
Total Protein: 7.1 g/dL (ref 6.0–8.3)

## 2021-02-07 LAB — VITAMIN B12: Vitamin B-12: 404 pg/mL (ref 211–911)

## 2021-02-07 LAB — VITAMIN D 25 HYDROXY (VIT D DEFICIENCY, FRACTURES): VITD: 38.83 ng/mL (ref 30.00–100.00)

## 2021-02-07 LAB — TSH: TSH: 1.24 u[IU]/mL (ref 0.35–4.50)

## 2021-02-07 NOTE — Patient Instructions (Signed)
Cervical polyps

## 2021-02-07 NOTE — Progress Notes (Signed)
Chief Complaint  Patient presents with   Annual Exam   Gynecologic Exam   Annual  1. C/o vaginal bleeding after not having bleed x 1 year  2. Right axilla benign lesion reduced in size will repeat with mammogram 06/2021  3. Tdap due    Review of Systems  Constitutional:  Negative for weight loss.  HENT:  Negative for hearing loss.   Eyes:  Negative for blurred vision.  Respiratory:  Negative for shortness of breath.   Cardiovascular:  Negative for chest pain.  Gastrointestinal:  Negative for abdominal pain.  Genitourinary:  Negative for dysuria.  Musculoskeletal:  Negative for falls and joint pain.  Skin:  Negative for rash.  Neurological:  Negative for dizziness and headaches.  Psychiatric/Behavioral:  Negative for depression.   Past Medical History:  Diagnosis Date   Allergy    seasonal   Past Surgical History:  Procedure Laterality Date   DILATION AND CURETTAGE OF UTERUS     WISDOM TOOTH EXTRACTION     Family History  Problem Relation Age of Onset   Diabetes Maternal Aunt    Ovarian cancer Maternal Grandmother    Prostate cancer Maternal Grandfather    ADD / ADHD Daughter        73 y.o as of 02/08/20   Social History   Socioeconomic History   Marital status: Married    Spouse name: Not on file   Number of children: Not on file   Years of education: Not on file   Highest education level: Not on file  Occupational History   Not on file  Tobacco Use   Smoking status: Never   Smokeless tobacco: Never  Substance and Sexual Activity   Alcohol use: Yes    Alcohol/week: 0.0 - 1.0 standard drinks   Drug use: No   Sexual activity: Yes    Partners: Male  Other Topics Concern   Not on file  Social History Narrative   Works Ship broker in [place]    Married 2 kids boy 57 y.o and girl 25 y.o 12/22/17    Social Determinants of Health   Financial Resource Strain: Not on file  Food Insecurity: Not on file  Transportation Needs: Not on file  Physical Activity: Not  on file  Stress: Not on file  Social Connections: Not on file  Intimate Partner Violence: Not on file   No outpatient medications have been marked as taking for the 02/07/21 encounter (Office Visit) with McLean-Scocuzza, Nino Glow, MD.   Allergies  Allergen Reactions   Flonase [Fluticasone] Rash   Penicillins Other (See Comments)   No results found for this or any previous visit (from the past 2160 hour(s)). Objective  Body mass index is 23.29 kg/m. Wt Readings from Last 3 Encounters:  02/07/21 139 lb 9.6 oz (63.3 kg)  09/27/20 142 lb 4 oz (64.5 kg)  02/08/20 139 lb (63 kg)   Temp Readings from Last 3 Encounters:  02/07/21 97.6 F (36.4 C) (Oral)  09/27/20 (!) 97.5 F (36.4 C)  07/18/20 97.9 F (36.6 C) (Oral)   BP Readings from Last 3 Encounters:  02/07/21 92/70  09/27/20 110/78  07/18/20 114/76   Pulse Readings from Last 3 Encounters:  02/07/21 63  09/27/20 65  07/18/20 88    Physical Exam Vitals and nursing note reviewed.  Constitutional:      Appearance: Normal appearance. She is well-developed and well-groomed.  HENT:     Head: Normocephalic and atraumatic.  Eyes:  Conjunctiva/sclera: Conjunctivae normal.     Pupils: Pupils are equal, round, and reactive to light.  Cardiovascular:     Rate and Rhythm: Normal rate and regular rhythm.     Heart sounds: Normal heart sounds.  Pulmonary:     Effort: Pulmonary effort is normal.     Breath sounds: Normal breath sounds.  Chest:     Chest wall: No mass.  Breasts:    Breasts are symmetrical.     Right: Normal. No axillary adenopathy.     Left: Normal. No axillary adenopathy.  Abdominal:     General: Abdomen is flat. Bowel sounds are normal.     Tenderness: There is no abdominal tenderness.  Genitourinary:    Exam position: Supine.     Pubic Area: No rash.      Labia:        Right: No rash.        Left: No rash.      Vagina: Normal.     Cervix: Discharge and cervical bleeding present.     Uterus:  Normal.      Adnexa: Right adnexa normal and left adnexa normal.     Comments: Cervical polyp at 12 oclock Lymphadenopathy:     Upper Body:     Right upper body: No axillary adenopathy.     Left upper body: No axillary adenopathy.  Skin:    General: Skin is warm and moist.  Neurological:     General: No focal deficit present.     Mental Status: She is alert and oriented to person, place, and time. Mental status is at baseline.     Gait: Gait normal.  Psychiatric:        Attention and Perception: Attention and perception normal.        Mood and Affect: Mood and affect normal.        Speech: Speech normal.        Behavior: Behavior normal. Behavior is cooperative.        Thought Content: Thought content normal.        Cognition and Memory: Cognition and memory normal.        Judgment: Judgment normal.    Assessment  Plan  Annual physical exam - Plan: Comprehensive metabolic panel, Lipid panel, CBC w/Diff, TSH, Urinalysis, Routine w reflex microscopic, Hepatitis C antibody, Vitamin D (25 hydroxy), Vitamin B12 Fasting labs today sch fasting labs  Had flu shot utd  Tdap had 10/11/10  given today  Hep B immune  covid 19 3/3   Continue exercise and healthy diet choices  Pap 12/22/17 neg pap neg HPV -pap today   02/04/18 mammogram [facility] [place] done 02/04/18 negative  -02/28/2019 negative  06/26/21 neg mammogram screening b/l with right [place] repeat   Colonoscopy age 14 referred  -- dermatology skin check tbse and patch test propylene glycol vs other saw 2019 negative will f/u prn with dermatology  -[address] 04/2020 app sch   Of note no cycles in 1 year but restarted bleeding  Bradycardia  Pt w/o sx's likely due to strenuous cardio   [place] kidney f/u 10/2020 proteinuria f/u in 2023 yearly     Cervical polyp - Plan: Ambulatory referral to Obstetrics / Gynecology Vaginal bleeding - Plan: Ambulatory referral to Obstetrics / Gynecology   Vitamin D deficiency - Plan: Vitamin  D (25 hydroxy)  B12 deficiency - Plan: Vitamin B12   Provider: Dr. Olivia Mackie McLean-Scocuzza-Internal Medicine

## 2021-02-08 LAB — CYTOLOGY - PAP
Comment: NEGATIVE
Diagnosis: NEGATIVE
High risk HPV: NEGATIVE

## 2021-02-08 LAB — URINALYSIS, ROUTINE W REFLEX MICROSCOPIC
Bacteria, UA: NONE SEEN /HPF
Bilirubin Urine: NEGATIVE
Glucose, UA: NEGATIVE
Hgb urine dipstick: NEGATIVE
Hyaline Cast: NONE SEEN /LPF
Ketones, ur: NEGATIVE
Leukocytes,Ua: NEGATIVE
Nitrite: NEGATIVE
RBC / HPF: NONE SEEN /HPF (ref 0–2)
Specific Gravity, Urine: 1.013 (ref 1.001–1.035)
WBC, UA: NONE SEEN /HPF (ref 0–5)
pH: 7 (ref 5.0–8.0)

## 2021-02-08 LAB — MICROSCOPIC MESSAGE

## 2021-02-08 LAB — HEPATITIS C ANTIBODY
Hepatitis C Ab: NONREACTIVE
SIGNAL TO CUT-OFF: 0.01 (ref <1.00)

## 2021-02-12 ENCOUNTER — Ambulatory Visit: Payer: 59

## 2021-02-19 ENCOUNTER — Ambulatory Visit: Payer: 59

## 2021-03-05 ENCOUNTER — Other Ambulatory Visit: Payer: Self-pay

## 2021-03-05 ENCOUNTER — Ambulatory Visit: Payer: 59 | Attending: Internal Medicine

## 2021-03-05 DIAGNOSIS — M6281 Muscle weakness (generalized): Secondary | ICD-10-CM | POA: Insufficient documentation

## 2021-03-05 DIAGNOSIS — N393 Stress incontinence (female) (male): Secondary | ICD-10-CM | POA: Insufficient documentation

## 2021-03-05 DIAGNOSIS — R278 Other lack of coordination: Secondary | ICD-10-CM | POA: Diagnosis present

## 2021-03-05 NOTE — Therapy (Signed)
[place] MAIN [facility] SERVICES [address] [place], [place], [address] Phone: [phone]   Fax:  [phone]  Physical Therapy Treatment  Patient Details  Name: Erika [name] MRN: [id number] Date of Birth: [birth year] Referring Provider (PT): McLean-Scocuzza, Nino Glow, MD   Encounter Date: 03/05/2021   PT End of Session - 03/05/21 1239     Visit Number 3    Number of Visits 10    Date for PT Re-Evaluation 04/22/21    Authorization Type UHC    PT Start Time 1003    PT Stop Time 1059    PT Time Calculation (min) 56 min    Activity Tolerance Patient tolerated treatment well    Behavior During Therapy [facility] for tasks assessed/performed             Past Medical History:  Diagnosis Date   Allergy    seasonal    Past Surgical History:  Procedure Laterality Date   DILATION AND CURETTAGE OF UTERUS     WISDOM TOOTH EXTRACTION      There were no vitals filed for this visit.   Subjective Assessment - 03/05/21 1006     Subjective Pt reported she's feeling better since having COVID, but still has minor cough and chest tightness with working out. Pt reported she's noticed urinary leakage with coughing but feels about 40-50% better prior to starting PT. pt reported some tightness in L proximal hamstring.    Pertinent History R axillary mass-benign and MD is following, perimenopause    Patient Stated Goals Improve leakage    Currently in Pain? No/denies              NMR: Access Code: [email or web address] URL: [email or web address] Date: 03/05/2021 Prepared by: Geoffry Paradise  Exercises Clamshell - 1 x daily - 4 x weekly - 3 sets - 10 reps with red band Diaphragmatic Breathing at 90/90 Supported - 3 x daily - 7 x weekly - 1 sets - 5 reps Seated Hamstring Stretch - 2-3 x daily - 7 x weekly - 1 sets - 3 reps Supine Pelvic Floor Contraction - 2 x daily - 7 x weekly - 1 sets - 10 reps - 10 hold with R and L hip abd. And incr.  Endurance reps to 10 reps vs. 5 reps. Supine Piriformis Stretch - 1 x daily - 7 x weekly - 1 sets - 3 reps - 30-60 hold Seated Table Piriformis Stretch - 1 x daily - 7 x weekly - 3 sets - 10 reps Supine piriformis stretch B LE x 30sec. Pigeon pose BLE x 30 sec.   Cues for proper technique for addition of hip abd (knees out) during PF contraction and during all new piriformis stretches.                  [place] Adult PT Treatment/Exercise - 03/05/21 1248       Manual Therapy   Manual Therapy Soft tissue mobilization;Joint mobilization    Manual therapy comments Pt reported decr. tension and discomfort after manual therapy.    Joint Mobilization PA to sacrum during L hip flexion and ext. in sidelying. B ankle distraction and PA mobs to B talocrural joint x 30sec.    Soft tissue mobilization Trigger point release and massage to L piriformis, TFL, and lateral hamstring.                    PT Education - 03/05/21 1238     Education Details  PT reviewed and progressed HEP. PT added to HEP. PT reviewed exercises to avoid during Crossfit and how to modify. PT educated pt on watching her HR during exercise after having COVID and to rest between exercise sets in order to incr. endurance.    Person(s) Educated Patient    Methods Explanation;Demonstration;Verbal cues;Tactile cues;Handout    Comprehension Returned demonstration;Verbalized understanding              PT Short Term Goals - 02/05/21 1243       PT SHORT TERM GOAL #1   Title Pt will be IND in HEP to decr. incontinence and improved strength. TARGET DATE FOR ALL STGS: 02/26/21    Baseline NO HEP    Time 5    Period Weeks    Status New      PT SHORT TERM GOAL #2   Title Have pt complete FOTO and write goals.    Baseline 5    Period Weeks    Status Achieved               PT Long Term Goals - 02/05/21 1243       PT LONG TERM GOAL #1   Title Pt will be IND in progressed HEP to improve strength,  coordination and incontinence. TARGET DATE FOR ALL LTGS: 04/02/21    Baseline NO HEP    Time 10    Period Weeks    Status New      PT LONG TERM GOAL #2   Title Pt will report no leaking with coughing/sneezing to improve QOL.    Baseline 10    Period Weeks    Status New      PT LONG TERM GOAL #3   Title Pt will report no leaking while working out at [employer] to improve QOL.    Time 10    Period Weeks    Status New      PT LONG TERM GOAL #4   Title Pt will improve FOTO score to >/=77 to improve QOL.    Baseline 72 urinary problems    Time 10    Period Weeks    Status New                   Plan - 03/05/21 1240     Clinical Impression Statement Pt demonstrated progress, as she tolerated progression of pelvic floor contraction with R and L hip abd in supine and incr. in endurance hold pelvic floor contractions. Pt also reported less urinary leakage and feels 40-50% improved since beginning PT. Pt's L piriformis, L TFL and L lateral hamstring discomfort and tension decr. after manual therapy and stretches. Pt's B ankle dorsiflexion improved after manual therapy, as decr. ankle/foot ROM and strength can impact pelvic floor based on kinectic chain. Pt would continue to benefit from skilled PT to improve incontinence during all ADLs. Goals pushed back by two weeks as pt missed appointments 2/2 positive with COVID.    Personal Factors and Comorbidities Comorbidity 2;Time since onset of injury/illness/exacerbation    Examination-Activity Limitations Locomotion Level;Lift;Carry;Continence    Examination-Participation Restrictions Other   gym   Stability/Clinical Decision Making Stable/Uncomplicated    Rehab Potential Good    PT Frequency 1x / week    PT Duration Other (comment)   10 weeks   PT Treatment/Interventions ADLs/Self Care Home Management;Stair training;Functional mobility training;Gait training;Therapeutic activities;Therapeutic exercise;Balance training;Neuromuscular  re-education;Patient/family education;Joint Manipulations    PT Next Visit Plan continue begin deep core work  for DR, palpate adductors and hamstrings for trigger points. Assess diaphragm for restrictions as needed. R SLS-genu recurvatum?, toileting posture.    PT Home Exercise Plan Medbridge: [email or web address]    Consulted and Agree with Plan of Care Patient             Patient will benefit from skilled therapeutic intervention in order to improve the following deficits and impairments:  Decreased balance, Postural dysfunction, Impaired flexibility, Decreased strength, Decreased coordination  Visit Diagnosis: Stress incontinence (female) (female)  Muscle weakness (generalized)  Other lack of coordination     Problem List Patient Active Problem List   Diagnosis Date Noted   Axillary mass, right 10/04/2020   Microalbuminuria 03/11/2020   Positive ANA (antinuclear antibody) 03/06/2020   Proteinuria 03/06/2020   Bradycardia 02/08/2020   Annual physical exam 02/01/2019   Hives 02/01/2019   Perimenopausal 02/01/2019    Sunnie Odden L 03/05/2021, 12:50 PM  [place] MAIN [facility] SERVICES [address] [place], [place], [address] Phone: [phone]   Fax:  [phone]  Name: Erika [name] MRN: [id number] Date of Birth: 1975/10/24  Geoffry Paradise, PT,DPT 03/05/21 12:50 PM Phone: [phone] Fax: [phone]

## 2021-03-12 ENCOUNTER — Other Ambulatory Visit: Payer: Self-pay

## 2021-03-12 ENCOUNTER — Ambulatory Visit: Payer: 59

## 2021-03-12 DIAGNOSIS — N393 Stress incontinence (female) (male): Secondary | ICD-10-CM

## 2021-03-12 DIAGNOSIS — M6281 Muscle weakness (generalized): Secondary | ICD-10-CM

## 2021-03-12 DIAGNOSIS — R278 Other lack of coordination: Secondary | ICD-10-CM

## 2021-03-12 NOTE — Therapy (Signed)
[place] MAIN [facility] SERVICES [address] [place], [place], [address] Phone: [phone]   Fax:  [phone]  Physical Therapy Treatment  Patient Details  Name: Erika [name] MRN: [id number] Date of Birth: 29-Jan-1976 Referring Provider (PT): McLean-Scocuzza, Nino Glow, MD   Encounter Date: 03/12/2021   PT End of Session - 03/12/21 1059     Visit Number 4    Number of Visits 10    Date for PT Re-Evaluation 04/22/21    Authorization Type UHC    PT Start Time 1001    PT Stop Time 1055    PT Time Calculation (min) 54 min    Activity Tolerance Patient tolerated treatment well    Behavior During Therapy [facility] for tasks assessed/performed             Past Medical History:  Diagnosis Date   Allergy    seasonal    Past Surgical History:  Procedure Laterality Date   DILATION AND CURETTAGE OF UTERUS     WISDOM TOOTH EXTRACTION      There were no vitals filed for this visit.   Subjective Assessment - 03/12/21 1003     Subjective Pt she still has a cough from COVID but feeling better. Pt stated hamstrings are still tight.    Pertinent History R axillary mass-benign and MD is following, perimenopause    Patient Stated Goals Improve leakage    Currently in Pain? No/denies                  NMR: 2.5-3 fingers width of diastasis noted in supine from superior umbilicus to xyphoid process. No difficulty performing B SLR with and without approximation of abdominals.  Quadruped:  ~ant/post tilts engaging TrA x10 reps/tilt.  ~With TrA engaged, pt performed shoulder flexion (one at a time) x 10 reps, then hip extension with knee extended (one at a time) x 10 reps.  Cues and demo for technique, rest breaks 2/2 weakness.  Access Code: [email or web address] URL: [email or web address] Date: 03/12/2021 Prepared by: Geoffry Paradise  Exercises REVIEWED Clamshell - 1 x daily - 4 x weekly - 3 sets - 10 reps Diaphragmatic Breathing at  90/90 Supported - 3 x daily - 7 x weekly - 1 sets - 5 reps Seated Hamstring Stretch - 2-3 x daily - 7 x weekly - 1 sets - 3 reps Supine Pelvic Floor Contraction - 2 x daily - 7 x weekly - 1 sets - 10 reps - 10 hold Supine Piriformis Stretch - 1 x daily - 7 x weekly - 1 sets - 3 reps - 30-60 hold Seated Table Piriformis Stretch - 1 x daily - 7 x weekly - 3 sets - 10 reps PERFORMED Supine Lower Trunk Rotation - 2 x daily - 7 x weekly - 1 sets - 3 reps - 60 hold Sidelying Open Book - 1 x daily - 7 x weekly - 1 sets - 10 reps Supine Snow angel with elbows in flexion x 10 reps.  Cues and demo for proper technique, all performed B.         [facility] Adult PT Treatment/Exercise - 03/12/21 1030       Exercises   Exercises Other Exercises    Other Exercises  B LTR stretch 3x60sec. and open book (B).      Manual Therapy   Manual Therapy Soft tissue mobilization    Manual therapy comments Pt reported decr. tension after manual therapy.    Soft tissue mobilization PT  performed massage and trigger point release over Tx and Lx spine paraspinals (R side incr. tension vs. L side). PT then had pt perform LTR stretch (B) and open book.                    PT Education - 03/12/21 1058     Education Details PT provided pt progressed HEP to lumbar/thoracic spine. PT discussed engaging TrA muscle during all transfers, bed mobility and activities.    Person(s) Educated Patient    Methods Explanation;Demonstration;Tactile cues;Verbal cues;Handout    Comprehension Returned demonstration;Verbalized understanding;Need further instruction              PT Short Term Goals - 02/05/21 1243       PT SHORT TERM GOAL #1   Title Pt will be IND in HEP to decr. incontinence and improved strength. TARGET DATE FOR ALL STGS: 02/26/21    Baseline NO HEP    Time 5    Period Weeks    Status New      PT SHORT TERM GOAL #2   Title Have pt complete FOTO and write goals.    Baseline 5    Period Weeks     Status Achieved               PT Long Term Goals - 02/05/21 1243       PT LONG TERM GOAL #1   Title Pt will be IND in progressed HEP to improve strength, coordination and incontinence. TARGET DATE FOR ALL LTGS: 04/02/21    Baseline NO HEP    Time 10    Period Weeks    Status New      PT LONG TERM GOAL #2   Title Pt will report no leaking with coughing/sneezing to improve QOL.    Baseline 10    Period Weeks    Status New      PT LONG TERM GOAL #3   Title Pt will report no leaking while working out at [employer] to improve QOL.    Time 10    Period Weeks    Status New      PT LONG TERM GOAL #4   Title Pt will improve FOTO score to >/=77 to improve QOL.    Baseline 72 urinary problems    Time 10    Period Weeks    Status New                   Plan - 03/12/21 1059     Clinical Impression Statement PT assessed for diastasis recti and pt noted to have 2.5-3 finger width separation from superior umbilicus to distal xypoid process. Therefore, today's skilled session focused on improving mobility of thoracolumbar junction fascia, paraspinals and TrA strength. Pt required cues and demo for proper technique and required rest breaks during quadraped activities 2/2 weakness. Pt would continue to benefit from skilled PT to improve leakage, strength and flexibility.    Personal Factors and Comorbidities Comorbidity 2;Time since onset of injury/illness/exacerbation    Examination-Activity Limitations Locomotion Level;Lift;Carry;Continence    Examination-Participation Restrictions Other   gym   Stability/Clinical Decision Making Stable/Uncomplicated    Rehab Potential Good    PT Frequency 1x / week    PT Duration Other (comment)   10 weeks   PT Treatment/Interventions ADLs/Self Care Home Management;Stair training;Functional mobility training;Gait training;Therapeutic activities;Therapeutic exercise;Balance training;Neuromuscular re-education;Patient/family education;Joint  Manipulations    PT Next Visit Plan Check STGs.  Assess diaphragm for  restrictions, paraspinal manual therapy, quadruped activities. as needed. R SLS-genu recurvatum?, toileting posture.    PT Home Exercise Plan Medbridge: [email or web address]    Consulted and Agree with Plan of Care Patient             Patient will benefit from skilled therapeutic intervention in order to improve the following deficits and impairments:  Decreased balance, Postural dysfunction, Impaired flexibility, Decreased strength, Decreased coordination  Visit Diagnosis: Stress incontinence (female) (female)  Muscle weakness (generalized)  Other lack of coordination     Problem List Patient Active Problem List   Diagnosis Date Noted   Axillary mass, right 10/04/2020   Microalbuminuria 03/11/2020   Positive ANA (antinuclear antibody) 03/06/2020   Proteinuria 03/06/2020   Bradycardia 02/08/2020   Annual physical exam 02/01/2019   Hives 02/01/2019   Perimenopausal 02/01/2019    Ilijah Doucet L 03/12/2021, 11:03 AM  [place] MAIN [facility] SERVICES [address] [place], [place], [address] Phone: [phone]   Fax:  [phone]  Name: Erika [name] MRN: [id number] Date of Birth: 01-18-1976   Geoffry Paradise, PT,DPT 03/12/21 11:03 AM Phone: [phone] Fax: [phone]

## 2021-03-12 NOTE — Patient Instructions (Addendum)
Access Code: [email or web address] URL: [email or web address] Date: 03/12/2021 Prepared by: Geoffry Paradise  Exercises Clamshell - 1 x daily - 4 x weekly - 3 sets - 10 reps Diaphragmatic Breathing at 90/90 Supported - 3 x daily - 7 x weekly - 1 sets - 5 reps Seated Hamstring Stretch - 2-3 x daily - 7 x weekly - 1 sets - 3 reps Supine Pelvic Floor Contraction - 2 x daily - 7 x weekly - 1 sets - 10 reps - 10 hold Supine Piriformis Stretch - 1 x daily - 7 x weekly - 1 sets - 3 reps - 30-60 hold Seated Table Piriformis Stretch - 1 x daily - 7 x weekly - 3 sets - 10 reps Supine Lower Trunk Rotation - 2 x daily - 7 x weekly - 1 sets - 3 reps - 60 hold Sidelying Open Book - 1 x daily - 7 x weekly - 1 sets - 10 reps Supine Snow angel with elbows in flexion x 10 reps.

## 2021-03-19 ENCOUNTER — Ambulatory Visit: Payer: 59

## 2021-03-19 ENCOUNTER — Other Ambulatory Visit: Payer: Self-pay

## 2021-03-19 DIAGNOSIS — R278 Other lack of coordination: Secondary | ICD-10-CM

## 2021-03-19 DIAGNOSIS — M6281 Muscle weakness (generalized): Secondary | ICD-10-CM

## 2021-03-19 DIAGNOSIS — N393 Stress incontinence (female) (male): Secondary | ICD-10-CM | POA: Diagnosis not present

## 2021-03-19 NOTE — Therapy (Signed)
[place] MAIN [facility] SERVICES [address] [place], [place], [address] Phone: [phone]   Fax:  [phone]  Physical Therapy Treatment  Patient Details  Name: Erika [name] MRN: [id number] Date of Birth: 1975-12-15 Referring Provider (PT): McLean-Scocuzza, Nino Glow, MD   Encounter Date: 03/19/2021   PT End of Session - 03/19/21 1117     Visit Number 5    Number of Visits 10    Date for PT Re-Evaluation 04/22/21    Authorization Type UHC    PT Start Time 1003    PT Stop Time 1057    PT Time Calculation (min) 54 min    Activity Tolerance Patient tolerated treatment well    Behavior During Therapy [facility] for tasks assessed/performed             Past Medical History:  Diagnosis Date   Allergy    seasonal    Past Surgical History:  Procedure Laterality Date   DILATION AND CURETTAGE OF UTERUS     WISDOM TOOTH EXTRACTION      There were no vitals filed for this visit.   Subjective Assessment - 03/19/21 1006     Subjective Pt denied changes since last session. Leakage has been about the same, she did have leakage during jump rope activity. Pt did not have a massage last Friday but is scheduled for this Thursday.    Pertinent History R axillary mass-benign and MD is following, perimenopause    Patient Stated Goals Improve leakage    Currently in Pain? No/denies                 NMR: Access Code: [email or web address] URL: [email or web address] Date: 03/19/2021 Prepared by: Geoffry Paradise  Exercises Clamshell - 1 x daily - 4 x weekly - 3 sets - 10 reps Diaphragmatic Breathing at 90/90 Supported - 3 x daily - 7 x weekly - 1 sets - 5 reps Seated Hamstring Stretch - 2-3 x daily - 7 x weekly - 1 sets - 3 reps Supine Pelvic Floor Contraction - 2 x daily - 7 x weekly - 1 sets - 10 reps - 10 hold Supine Piriformis Stretch - 1 x daily - 7 x weekly - 1 sets - 3 reps - 30-60 hold Seated Table Piriformis Stretch - 1 x  daily - 7 x weekly - 3 sets - 10 reps Supine Lower Trunk Rotation - 2 x daily - 7 x weekly - 1 sets - 3 reps - 60 hold Sidelying Open Book - 1 x daily - 7 x weekly - 1 sets - 10 reps Sidelying Feet Elevated Clamshells - 1 x daily - 4 x weekly - 3 sets - 10 reps Supine angels with towel roll placed along tx spine x 10 reps.  Quadruped: ~ant/post tilts engaging TrA x10 reps/tilt. Cues to decr. Back compensatory strategy and to decr. Ant. Pelvic tilt end range.  Pelvic tilts performed in supine 2x10 reps, seated x 10 reps and standing x 5 reps with manual facilitation to improve pelvic isolation and cues to decr. Thoracic and LEs compensatory strategies.   All activities performed with S for safety. Cues and demo for proper technique.               [facility] Adult PT Treatment/Exercise - 03/19/21 1110       Manual Therapy   Manual Therapy Soft tissue mobilization    Manual therapy comments Pt reported decr. tension after manual therapy.    Joint Mobilization  PT performed PAs to T1-T10, Grade 3-4 for 30 sec. x2/joint to improve thoracic spine mobility/ext. PT had pt perform supine angels with towel rolled place along tx spine x10 reps to maintain gains made during session.    Soft tissue mobilization PT performed STM to B paraspinals (R side noted to have incr. tightness). in order to improve thoracolumbar fascia mobility.                  SELF CARE:  PT Education - 03/19/21 1116     Education Details PT educated pt on proper toileting technique to improve relaxation of pelvic floor and to empty bladder. PT reviewed STGs progress and progressed HEP as tolerated. PT reiterated the importance of stretching before and after workout.    Person(s) Educated Patient    Methods Explanation;Demonstration;Tactile cues;Verbal cues;Handout    Comprehension Returned demonstration;Verbalized understanding;Need further instruction              PT Short Term Goals - 03/19/21 1011        PT SHORT TERM GOAL #1   Title Pt will be IND in HEP to decr. incontinence and improved strength. TARGET DATE FOR ALL STGS: 02/26/21    Baseline NO HEP    Time 5    Period Weeks    Status Achieved      PT SHORT TERM GOAL #2   Title Have pt complete FOTO and write goals.    Baseline 5    Period Weeks    Status Achieved               PT Long Term Goals - 02/05/21 1243       PT LONG TERM GOAL #1   Title Pt will be IND in progressed HEP to improve strength, coordination and incontinence. TARGET DATE FOR ALL LTGS: 04/02/21    Baseline NO HEP    Time 10    Period Weeks    Status New      PT LONG TERM GOAL #2   Title Pt will report no leaking with coughing/sneezing to improve QOL.    Baseline 10    Period Weeks    Status New      PT LONG TERM GOAL #3   Title Pt will report no leaking while working out at [employer] to improve QOL.    Time 10    Period Weeks    Status New      PT LONG TERM GOAL #4   Title Pt will improve FOTO score to >/=77 to improve QOL.    Baseline 72 urinary problems    Time 10    Period Weeks    Status New                   Plan - 03/19/21 1117     Clinical Impression Statement Pt demonstrated progress as she met STGs 1 and 2. Pt demonstrated improved pelvic floor strength as she was able to progress pelvic floor contractions to seated position vs. supine. Pt continues to experience decr. strength in deep core musculature and hypomobility of throacic spine, which impacts ability of pelvic bone and muscles to move and contract effectively. Pt would continue to benefit from an interdependent regional approach to address deficits as alignment and strength in kinetic chain impacts pelvic floor funciton. Pt would continue to benefit from skilled PT to improve leakage, strength, posture, and mobility duirng all ADLs.    PT Treatment/Interventions ADLs/Self Care Home Management;Stair training;Functional  mobility training;Gait  training;Therapeutic activities;Therapeutic exercise;Balance training;Neuromuscular re-education;Patient/family education;Joint Manipulations    PT Next Visit Plan Pelvic tilts. Assess diaphragm for restrictions, paraspinal manual therapy, quadruped activities. as needed. R SLS-genu recurvatum?, toileting posture review prn    PT Home Exercise Plan Medbridge: [email or web address]    Consulted and Agree with Plan of Care Patient             Patient will benefit from skilled therapeutic intervention in order to improve the following deficits and impairments:     Visit Diagnosis: Stress incontinence (female) (female)  Other lack of coordination  Muscle weakness (generalized)     Problem List Patient Active Problem List   Diagnosis Date Noted   Axillary mass, right 10/04/2020   Microalbuminuria 03/11/2020   Positive ANA (antinuclear antibody) 03/06/2020   Proteinuria 03/06/2020   Bradycardia 02/08/2020   Annual physical exam 02/01/2019   Hives 02/01/2019   Perimenopausal 02/01/2019    Senan Urey L 03/19/2021, 11:21 AM  [place] MAIN [facility] SERVICES [address] [place], [place], [address] Phone: [phone]   Fax:  [phone]  Name: Erika [name] MRN: [id number] Date of Birth: 1976/04/11   Geoffry Paradise, PT,DPT 03/19/21 11:22 AM Phone: [phone] Fax: [phone]

## 2021-03-26 ENCOUNTER — Ambulatory Visit: Payer: 59 | Attending: Internal Medicine

## 2021-03-26 ENCOUNTER — Other Ambulatory Visit: Payer: Self-pay

## 2021-03-26 DIAGNOSIS — R278 Other lack of coordination: Secondary | ICD-10-CM | POA: Diagnosis present

## 2021-03-26 DIAGNOSIS — N393 Stress incontinence (female) (male): Secondary | ICD-10-CM | POA: Insufficient documentation

## 2021-03-26 DIAGNOSIS — M6281 Muscle weakness (generalized): Secondary | ICD-10-CM | POA: Insufficient documentation

## 2021-03-26 NOTE — Therapy (Signed)
[place] MAIN [facility] SERVICES [address] [place], [place], [address] Phone: [phone]   Fax:  [phone]  Physical Therapy Treatment  Patient Details  Name: Erika [name] MRN: [id number] Date of Birth: 03-21-1976 Referring Provider (PT): McLean-Scocuzza, Nino Glow, MD   Encounter Date: 03/26/2021   PT End of Session - 03/26/21 1055     Visit Number 6    Number of Visits 10    Date for PT Re-Evaluation 04/22/21    Authorization Type UHC    PT Start Time 1003    PT Stop Time 1058    PT Time Calculation (min) 55 min    Activity Tolerance Patient tolerated treatment well    Behavior During Therapy [facility] for tasks assessed/performed             Past Medical History:  Diagnosis Date   Allergy    seasonal    Past Surgical History:  Procedure Laterality Date   DILATION AND CURETTAGE OF UTERUS     WISDOM TOOTH EXTRACTION      There were no vitals filed for this visit.   Subjective Assessment - 03/26/21 1005     Subjective Pt denied changes since last visit, she is coughing less and hasn't jump roped at the gym so she hasn't leaked as much. She has been mindful of toileting posture and has ordered a squatty potty. Pt had a massage last week and focused on posterior chain musculature and really got into her feet.    Pertinent History R axillary mass-benign and MD is following, perimenopause    Patient Stated Goals Improve leakage                               [facility] Adult PT Treatment/Exercise - 03/26/21 1026       Neuro Re-ed    Neuro Re-ed Details  Quadruped: performed cat/cow x10 reps to improve ROM, then performed ant/post pelvic tilts with TrA activation, progressed to unilateral shoulder flexion x10/UE and hip ext x10 reps/LE, followed by shoulder flex. with contralat. LE hip ext with TrA engaged x10 reps/UE/LE. Standing: pt performed lower trap exercise with BUE (one a time) in U and V shape to improve  lower trap activation to stabilize shoulder and maintain gains made during manual therapy. B lower trap strength assessed: 3+/5.      Manual Therapy   Manual Therapy Soft tissue mobilization;Joint mobilization;Muscle Energy Technique    Manual therapy comments Pt reported decr. tension after manual therapy. All manual therapy performed to improve alignment so pelvic floor contraction strengthens. Ended with pt practicing relaxation (breathing) with hot pack placed on back with layers for skin integrity.    Joint Mobilization PT performed PAs to T1-T10, Grade 3-4 for 30 sec. x2/joint to improve thoracic spine mobility/ext. PT also performed PA joint mobs to talocrural joing (B) with DF stretch and MET x 2 reps/ankle to B ankles to improve ROM and flexibility.    Soft tissue mobilization PT performed STM to R and L paraspinals with incr. in R side along Tx spine. Performed to reduce tension.    Muscle Energy Technique see joint mob section above for details.                    PT Education - 03/26/21 1055     Education Details PT educated pt on new lower trap HEP.    Person(s) Educated Patient  Methods Explanation;Demonstration;Verbal cues    Comprehension Need further instruction              PT Short Term Goals - 03/19/21 1011       PT SHORT TERM GOAL #1   Title Pt will be IND in HEP to decr. incontinence and improved strength. TARGET DATE FOR ALL STGS: 02/26/21    Baseline NO HEP    Time 5    Period Weeks    Status Achieved      PT SHORT TERM GOAL #2   Title Have pt complete FOTO and write goals.    Baseline 5    Period Weeks    Status Achieved               PT Long Term Goals - 02/05/21 1243       PT LONG TERM GOAL #1   Title Pt will be IND in progressed HEP to improve strength, coordination and incontinence. TARGET DATE FOR ALL LTGS: 04/02/21    Baseline NO HEP    Time 10    Period Weeks    Status New      PT LONG TERM GOAL #2   Title Pt will  report no leaking with coughing/sneezing to improve QOL.    Baseline 10    Period Weeks    Status New      PT LONG TERM GOAL #3   Title Pt will report no leaking while working out at [employer] to improve QOL.    Time 10    Period Weeks    Status New      PT LONG TERM GOAL #4   Title Pt will improve FOTO score to >/=77 to improve QOL.    Baseline 72 urinary problems    Time 10    Period Weeks    Status New                   Plan - 03/26/21 1055     Clinical Impression Statement Pt demonstrated progress as she required less cues for pelvic tilts in quadruped and was able to self correct posture in quadruped. Pt reported leakage is 50-60% improved since beginning PT. Pt continues to experience Tx spine hypomobility, limited B ankle DF, and incr. tension in paraspinals and intermittent leakage. Pt's B lower trap strength assessed in prone: 3+/5, this is a likely contributor to overactive paraspinals and hypomobility of Tx spine, which impacts pelvic floor alignment and ability to effectively contract pelvic floor. Pt would continue to benefit from skilled PT to improve deficits listed above.    PT Treatment/Interventions ADLs/Self Care Home Management;Stair training;Functional mobility training;Gait training;Therapeutic activities;Therapeutic exercise;Balance training;Neuromuscular re-education;Patient/family education;Joint Manipulations    PT Next Visit Plan Pelvic tilts. Assess diaphragm for restrictions, paraspinal manual therapy, quadruped activities. as needed. R SLS-genu recurvatum?, toileting posture review prn    PT Home Exercise Plan Medbridge: [email or web address]    Consulted and Agree with Plan of Care Patient             Patient will benefit from skilled therapeutic intervention in order to improve the following deficits and impairments:     Visit Diagnosis: Stress incontinence (female) (female)  Other lack of coordination  Muscle weakness  (generalized)     Problem List Patient Active Problem List   Diagnosis Date Noted   Axillary mass, right 10/04/2020   Microalbuminuria 03/11/2020   Positive ANA (antinuclear antibody) 03/06/2020   Proteinuria 03/06/2020   Bradycardia 02/08/2020  Annual physical exam 02/01/2019   Hives 02/01/2019   Perimenopausal 02/01/2019    Manoj Enriquez L 03/26/2021, 10:59 AM  [place] MAIN [facility] SERVICES [address] [place], [place], [address] Phone: [phone]   Fax:  [phone]  Name: Erika [name] MRN: [id number] Date of Birth: 1976-04-19  Geoffry Paradise, PT,DPT 03/26/21 11:00 AM Phone: [phone] Fax: [phone]

## 2021-03-27 ENCOUNTER — Encounter: Payer: Self-pay | Admitting: Internal Medicine

## 2021-03-27 ENCOUNTER — Telehealth: Payer: Self-pay | Admitting: Internal Medicine

## 2021-03-27 DIAGNOSIS — R2231 Localized swelling, mass and lump, right upper limb: Secondary | ICD-10-CM

## 2021-03-27 NOTE — Telephone Encounter (Signed)
Patient states she is okay with this. Had previous imaging done at [facility] but was informed this is no longer in network.   States her OBGYN office can do her mammogram. States she follow ups with them next week. She will ask at her appointment with them if they can do a diagnostic mammogram and ultrasound. If so she will then ask if they need our orders or if they will put these in themselves.

## 2021-03-27 NOTE — Telephone Encounter (Signed)
For underarm mass right breast follow up mammogram facility rec  [place] 06/2021 pt rec bilateral diagnostic mammo is she ok with this

## 2021-04-01 DIAGNOSIS — N393 Stress incontinence (female) (male): Secondary | ICD-10-CM | POA: Insufficient documentation

## 2021-04-07 NOTE — Telephone Encounter (Signed)
Can you call this patient?  I am trying to figure out if she needs a diagnostic mammogram ordered or if it needs to be screening.  This message notes that there is a mass in her right underarm and I want to see if that is something that is still there and if this is new or if this has been an issue they have been following.  It is not completely clear based on review of notes.  Her mammogram last year did not see a mass and recommended repeat screening mammogram in November 2022.  Either way I would like to go ahead and get this ordered for her while Dr. Terese Door is out so it can be scheduled for when it needs to be done.

## 2021-04-08 NOTE — Telephone Encounter (Signed)
For your information  

## 2021-04-09 ENCOUNTER — Ambulatory Visit: Payer: 59

## 2021-04-09 ENCOUNTER — Other Ambulatory Visit: Payer: Self-pay

## 2021-04-09 DIAGNOSIS — R278 Other lack of coordination: Secondary | ICD-10-CM

## 2021-04-09 DIAGNOSIS — M6281 Muscle weakness (generalized): Secondary | ICD-10-CM

## 2021-04-09 DIAGNOSIS — N393 Stress incontinence (female) (male): Secondary | ICD-10-CM | POA: Diagnosis not present

## 2021-04-09 NOTE — Therapy (Signed)
[place] MAIN [facility] SERVICES [address] [place], [place], [address] Phone: [phone]   Fax:  [phone]  Physical Therapy Treatment  Patient Details  Name: Erika [name] MRN: [id number] Date of Birth: 12-16-1975 Referring Provider (PT): McLean-Scocuzza, Nino Glow, MD   Encounter Date: 04/09/2021   PT End of Session - 04/09/21 1110     Visit Number 7    Number of Visits 10    Date for PT Re-Evaluation 04/22/21    Authorization Type UHC    PT Start Time 1006    PT Stop Time 1100    PT Time Calculation (min) 54 min    Activity Tolerance Patient tolerated treatment well    Behavior During Therapy [facility] for tasks assessed/performed             Past Medical History:  Diagnosis Date   Allergy    seasonal    Past Surgical History:  Procedure Laterality Date   DILATION AND CURETTAGE OF UTERUS     WISDOM TOOTH EXTRACTION      There were no vitals filed for this visit.   Subjective Assessment - 04/09/21 1010     Subjective Pt denied any changes since last visit. Leakage has been about the same, it has improved since beginning PT but it's still occurring-sneezing, jump rope.    Pertinent History R axillary mass-benign and MD is following, perimenopause    Patient Stated Goals Improve leakage    Currently in Pain? No/denies               NMR: Access Code: [email or web address] URL: [email or web address] Date: 04/09/2021 Prepared by: Geoffry Paradise  Exercises REVIEW ONLY: Clamshell - 1 x daily - 4 x weekly - 3 sets - 10 reps Diaphragmatic Breathing at 90/90 Supported - 3 x daily - 7 x weekly - 1 sets - 5 reps Seated Hamstring Stretch - 2-3 x daily - 7 x weekly - 1 sets - 3 reps Supine Pelvic Floor Contraction - 2 x daily - 7 x weekly - 1 sets - 10 reps - 10 hold Supine Piriformis Stretch - 1 x daily - 7 x weekly - 1 sets - 3 reps - 30-60 hold Seated Table Piriformis Stretch - 1 x daily - 7 x weekly - 3 sets - 10  reps Supine Lower Trunk Rotation - 2 x daily - 7 x weekly - 1 sets - 3 reps - 60 hold Sidelying Open Book - 1 x daily - 7 x weekly - 1 sets - 10 reps Sidelying Feet Elevated Clamshells - 1 x daily - 4 x weekly - 3 sets - 10 reps Supine Angels - 1-2 x daily - 7 x weekly - 1 sets - 10 reps PERFORMED: Supine Butterfly Groin Stretch - 1 x daily - 7 x weekly - 1 sets - 3 reps - 30-45 hold Supine Pelvic Floor Stretch - 1 x daily - 7 x weekly - 1 sets - 3 reps - 30-45 hold Half Kneeling Hip Flexor Stretch - 1 x daily - 7 x weekly - 1 sets - 3 reps - 30-45 hold Beginner Front Arm Support - 1 x daily - 3 x weekly - 2-3 sets - 5 reps Quadruped Hip Abduction and External Rotation - 1 x daily - 3 x weekly - 2-3 sets - 5 reps Reverse Crunch - 1 x daily - 3 x weekly - 2 sets - 10 reps Low Trap Setting at Wall - 1 x daily -  3 x weekly - 3 sets - 10 reps  Cues and demo for proper technique. Performed with S for safety. No pain reported.              Pelvic Floor Special Questions - 04/09/21 1112     Pelvic Floor Internal Exam yes    Exam Type Vaginal    Sensation intact    Strength good squeeze, good lift, able to hold agaisnt strong resistance    Strength # of reps 10    Strength # of seconds 3   with pt re-contracting at 5 and 7 seconds.   Tone Incr. tone of levator ani (puborectalis especially) R side more than L side. Trigger point release performed B.                       PT Education - 04/09/21 1043     Education Details PT reviewed and progressed HEP. PT provided pt with alternative strengthening exercises to perform during CrossFit. PT educated pt on internal muscle assessment and to make sure she's holding endurance contraction vs. hold, relax, contract again.    Person(s) Educated Patient    Methods Explanation;Demonstration;Tactile cues;Verbal cues;Handout    Comprehension Returned demonstration;Verbalized understanding;Need further instruction               PT Short Term Goals - 03/19/21 1011       PT SHORT TERM GOAL #1   Title Pt will be IND in HEP to decr. incontinence and improved strength. TARGET DATE FOR ALL STGS: 02/26/21    Baseline NO HEP    Time 5    Period Weeks    Status Achieved      PT SHORT TERM GOAL #2   Title Have pt complete FOTO and write goals.    Baseline 5    Period Weeks    Status Achieved               PT Long Term Goals - 04/09/21 1116       PT LONG TERM GOAL #1   Title Pt will be IND in progressed HEP to improve strength, coordination and incontinence. TARGET DATE FOR ALL LTGS: 04/02/21    Time 10    Period Weeks    Status On-going      PT LONG TERM GOAL #2   Title Pt will report no leaking with coughing/sneezing to improve QOL.    Baseline 04/09/21: has improved at least 60% per pt since beginning PT.    Time 10    Period Weeks    Status Partially Met      PT LONG TERM GOAL #3   Title Pt will report no leaking while working out at [employer] to improve QOL.    Baseline 04/09/21: has improved approx. 60%    Time 10    Period Weeks    Status Partially Met      PT LONG TERM GOAL #4   Title Pt will improve FOTO score to >/=77 to improve QOL.    Baseline 72 urinary problems    Time 10    Period Weeks    Status Deferred                   Plan - 04/09/21 1111     Clinical Impression Statement PT performed internal muscle assessment today. Pt pelvic floor muscle strength 4/5 with incr. tone noted in B levator ani (R > L ). Pt able  to hold endurance contraction for 3 sec., then noted to re-contract at 5 and 7 sec. during 10 sec. hold. Pt denied pain during exam and reported less tension after manual therapy and stretches. PT then provided pt with alternative exercises to perform during [employer] to reduce strain and leakage. PT requesting POC be extended by one month, 2/2 primary PT will be out for 4 weeks. Therefore all unmet or ongoing goals will be carried over. Pt continues to experience  pain, weakness, and leakage and would benefit from continued skilled PT to improve improve deficits during all ADLs.    PT Frequency 1x / week    PT Duration --   original POC: 10 weeks. requesting POC be extended by 4 weeks to allow 4 week break as primary PT will be out   PT Treatment/Interventions ADLs/Self Care Home Management;Stair training;Functional mobility training;Gait training;Therapeutic activities;Therapeutic exercise;Balance training;Neuromuscular re-education;Patient/family education;Joint Manipulations    PT Next Visit Plan Pelvic tilts. Assess diaphragm for restrictions, paraspinal manual therapy, quadruped activities. as needed. R SLS-genu recurvatum?,    PT Home Exercise Plan Medbridge: [email or web address]    Consulted and Agree with Plan of Care Patient             Patient will benefit from skilled therapeutic intervention in order to improve the following deficits and impairments:     Visit Diagnosis: Stress incontinence (female) (female) - Plan: PT plan of care cert/re-cert  Muscle weakness (generalized) - Plan: PT plan of care cert/re-cert  Other lack of coordination - Plan: PT plan of care cert/re-cert     Problem List Patient Active Problem List   Diagnosis Date Noted   Axillary mass, right 10/04/2020   Microalbuminuria 03/11/2020   Positive ANA (antinuclear antibody) 03/06/2020   Proteinuria 03/06/2020   Bradycardia 02/08/2020   Annual physical exam 02/01/2019   Hives 02/01/2019   Perimenopausal 02/01/2019    Shanta Hartner L 04/09/2021, 11:21 AM  [place] MAIN [facility] SERVICES [address] [place], [place], [address] Phone: [phone]   Fax:  [phone]  Name: Erika [name] MRN: [id number] Date of Birth: 22-Jun-1976  Geoffry Paradise, PT,DPT 04/09/21 11:23 AM Phone: [phone] Fax: [phone]

## 2021-04-26 ENCOUNTER — Telehealth: Payer: Self-pay

## 2021-04-26 NOTE — Telephone Encounter (Signed)
Confirmed signed and faxed Medical records form to [facility]. Provider message on form has been sent to scan.

## 2021-04-29 NOTE — Telephone Encounter (Signed)
Orders placed.

## 2021-05-21 ENCOUNTER — Ambulatory Visit: Payer: 59

## 2021-05-28 ENCOUNTER — Other Ambulatory Visit: Payer: Self-pay

## 2021-05-28 ENCOUNTER — Ambulatory Visit: Payer: 59 | Attending: Internal Medicine

## 2021-05-28 DIAGNOSIS — R278 Other lack of coordination: Secondary | ICD-10-CM | POA: Diagnosis not present

## 2021-05-28 DIAGNOSIS — M6281 Muscle weakness (generalized): Secondary | ICD-10-CM | POA: Insufficient documentation

## 2021-05-28 DIAGNOSIS — N393 Stress incontinence (female) (male): Secondary | ICD-10-CM

## 2021-05-28 NOTE — Therapy (Signed)
[place] MAIN [facility] SERVICES [address] [place], [place], [address] Phone: [phone]   Fax:  [phone]  Physical Therapy Treatment  Patient Details  Name: Erika [name] MRN: [id number] Date of Birth: July 07, 1976 Referring Provider (PT): McLean-Scocuzza, Nino Glow, MD   Encounter Date: 05/28/2021   PT End of Session - 05/28/21 1106     Visit Number 8    Number of Visits 10   pt asking for 4 additional visits every 2 weeks   Date for PT Re-Evaluation 07/27/21    Authorization Type UHC    PT Start Time 1005   pt arrived late   PT Stop Time 1059    PT Time Calculation (min) 54 min    Activity Tolerance Patient tolerated treatment well    Behavior During Therapy W[facility] for tasks assessed/performed             Past Medical History:  Diagnosis Date   Allergy    seasonal    Past Surgical History:  Procedure Laterality Date   DILATION AND CURETTAGE OF UTERUS     WISDOM TOOTH EXTRACTION      There were no vitals filed for this visit.   Subjective Assessment - 05/28/21 1008     Subjective Pt reported she's been doing ok but hasn't had as much time to perform HEP with work and kids back in school. Pt reported leakage still occurs some, but has improved. It still occurs while jump roping but not during coughing or sneezing. Pt denied pain. Pt stated her back and shoulders have improved, with sport's massage twice a month.    Pertinent History R axillary mass-benign and MD is following, perimenopause    Patient Stated Goals Improve leakage    Currently in Pain? No/denies                   Pelvic Floor Physical Therapy Re-Evaluation and Assessment   OBJECTIVE  Posture/Observations:  Sitting: rounded shoulders  Standing: rounded shoulders  Palpation/Segmental Motion/Joint Play: hypomobility of tx spine, coccyx flexed position     Range of Motion/Flexibilty:  Spine: WNL Hips:decr. Hip IR (B)   Strength/MMT:   LE MMT  B knee flex: 5/5 B knee ext: 5/5 B ankle DF: 5/5 LE MMT Left Right  Hip flex:  (L2) 5/5 5/5  Hip ext: /5 /5  Hip abd: 4/5 4/5  Hip add: 4/5 4/5  Hip IR /5 /5  Hip ER /5 /5     Abdominal:  Palpation: no TTP Diastasis: 1.5-2 fingers width 1" superior to umbilicus and 1 finger width 1" inf. To umbilicus  Pelvic Floor External Exam: Introitus Appears:  Skin integrity:  Palpation: Cough: Prolapse visible?: Scar mobility: Through clothing: yes Ischial tuberosities: no TTP Palpation for pelvic floor contraction: able to palpate  Coccyx: stuck in flexion     NMR:  B Open book in sidelying x10 reps/side,  supine angels x10 reps in hooklying to support back. Performed after tx spine jt. Mobs to reinforce improve mobility of tx spine.  Demo and manual cues for proper technique, performed with S for safety.             [facility] Adult PT Treatment/Exercise - 05/28/21 1051       Manual Therapy   Manual Therapy Soft tissue mobilization;Joint mobilization    Manual therapy comments Pt reported decr. tension after manual therapy.    Joint Mobilization PT performed PAs to T1-T10, Grade 3-4 for 30 sec.  x2/joint to improve thoracic spine mobility/ext. PT performed B hip PA mobs 3x30sec. Grade 3 to improve ROM. PT performed sustained PA pressure to coccyx with pt seated performing x 5 reps of PF contractions as coccyx stuck in flexion. PT performed PA mobs to B SI joints with pt in sidelying and PT moving pt's ipsilateral LE in to hip ext/flex during mob x2 reps for 30 sec.    Soft tissue mobilization PT performed STM to R and L Lx and Tx paraspinals with incr.tension in R side along Tx spine. Performed to reduce tension.                     PT Education - 05/28/21 1105     Education Details PT reviewed HEP and educated pt on which exercises to focus on. PT discussed POC and exam findings.    Person(s) Educated Patient    Methods  Explanation;Demonstration;Verbal cues    Comprehension Verbalized understanding;Returned demonstration              PT Short Term Goals - 03/19/21 1011       PT SHORT TERM GOAL #1   Title Pt will be IND in HEP to decr. incontinence and improved strength. TARGET DATE FOR ALL STGS: 02/26/21    Baseline NO HEP    Time 5    Period Weeks    Status Achieved      PT SHORT TERM GOAL #2   Title Have pt complete FOTO and write goals.    Baseline 5    Period Weeks    Status Achieved               PT Long Term Goals - 05/28/21 1107       PT LONG TERM GOAL #1   Title Pt will be IND in progressed HEP to improve strength, coordination and incontinence. TARGET DATE FOR ALL LTGS: 07/23/21    Time 10    Period Weeks    Status On-going      PT LONG TERM GOAL #2   Title Pt will report demonstrate proper PF muscle contraction prior to coughing/sneezing to prevent leakage during functional mobility..    Baseline 04/09/21: has improved at least 60% per pt since beginning PT.    Time 10    Period Weeks    Status On-going      PT LONG TERM GOAL #3   Title Pt will demonstrate ability to hold PF contraction for >10 sec. in order to report no leaking while working out at [employer] activities (jumproping) to improve QOL.    Baseline 04/09/21: has improved approx. 60%    Time 10    Period Weeks    Status On-going      PT LONG TERM GOAL #4   Title Pt will improve FOTO score to >/=77 to improve QOL.    Baseline 72 urinary problems    Time 10    Period Weeks    Status On-going                   Plan - 05/28/21 1109     Clinical Impression Statement Today's session focused on re-eval as pt has not been seen since August 2/2 primary PT being out. Pt demonstrated progress as her diastasis recti has improve and her hip strength has also improved. Pt continues to experience SUI during activites which require endurance hold of PF contraction. Pt's SLS balance is impaired. Pt's  coccyx also  is in flexed position and has hypomobility of Tx spine, which can contribute to alignment of PFM and incr. difficulty performing contractions to prevent leakage. Pt would continue to benefit from skilled PT to improve leakage and strength and ROM during all activities.    PT Frequency 1x / week    PT Duration --   1 visit every other week for 4 visits total.   PT Treatment/Interventions ADLs/Self Care Home Management;Stair training;Functional mobility training;Gait training;Therapeutic activities;Therapeutic exercise;Balance training;Neuromuscular re-education;Patient/family education;Joint Manipulations;Manual techniques;Spinal Manipulations    PT Next Visit Plan Pelvic tilts. Internal exam, tx spine manipulation as indicated. SLS with stepping and PF contraction.paraspinal manual therapy, quadruped activities. as needed. R SLS-genu recurvatum?,    PT Home Exercise Plan Medbridge: [email or web address]    Consulted and Agree with Plan of Care Patient             Patient will benefit from skilled therapeutic intervention in order to improve the following deficits and impairments:     Visit Diagnosis: Stress incontinence (female) (female) - Plan: PT plan of care cert/re-cert  Muscle weakness (generalized) - Plan: PT plan of care cert/re-cert  Other lack of coordination - Plan: PT plan of care cert/re-cert     Problem List Patient Active Problem List   Diagnosis Date Noted   Axillary mass, right 10/04/2020   Microalbuminuria 03/11/2020   Positive ANA (antinuclear antibody) 03/06/2020   Proteinuria 03/06/2020   Bradycardia 02/08/2020   Annual physical exam 02/01/2019   Hives 02/01/2019   Perimenopausal 02/01/2019    Yasin Ducat L, PT 05/28/2021, 11:17 AM  [place] MAIN [facility] SERVICES [address] [place], [place], [address] Phone: [phone]   Fax:  [phone]  Name: Erika [name] MRN: [id number] Date of Birth:  1975-11-17  Geoffry Paradise, PT,DPT 05/28/21 11:19 AM Phone: [phone] Fax: [phone]

## 2021-05-28 NOTE — Addendum Note (Signed)
Addended by: Elza Rafter on: 05/28/2021 11:23 AM   Modules accepted: Orders

## 2021-06-25 ENCOUNTER — Ambulatory Visit: Payer: 59 | Attending: Internal Medicine

## 2021-06-25 ENCOUNTER — Other Ambulatory Visit: Payer: Self-pay

## 2021-06-25 DIAGNOSIS — M6281 Muscle weakness (generalized): Secondary | ICD-10-CM | POA: Insufficient documentation

## 2021-06-25 DIAGNOSIS — R278 Other lack of coordination: Secondary | ICD-10-CM | POA: Diagnosis present

## 2021-06-25 DIAGNOSIS — N393 Stress incontinence (female) (male): Secondary | ICD-10-CM | POA: Insufficient documentation

## 2021-06-25 NOTE — Patient Instructions (Signed)
Access Code: [email or web address] URL: [email or web address] Date: 06/25/2021 Prepared by: Geoffry Paradise  Exercises Clamshell - 1 x daily - 4 x weekly - 3 sets - 10 reps Diaphragmatic Breathing at 90/90 Supported - 3 x daily - 7 x weekly - 1 sets - 5 reps Seated Hamstring Stretch - 2-3 x daily - 7 x weekly - 1 sets - 3 reps Supine Pelvic Floor Contraction - 2 x daily - 7 x weekly - 1 sets - 10 reps - 10 hold Supine Piriformis Stretch - 1 x daily - 7 x weekly - 1 sets - 3 reps - 30-60 hold Seated Table Piriformis Stretch - 1 x daily - 7 x weekly - 3 sets - 10 reps Supine Lower Trunk Rotation - 2 x daily - 7 x weekly - 1 sets - 3 reps - 60 hold Sidelying Open Book - 1 x daily - 7 x weekly - 1 sets - 10 reps Sidelying Feet Elevated Clamshells - 1 x daily - 4 x weekly - 3 sets - 10 reps Supine Angels - 1-2 x daily - 7 x weekly - 1 sets - 10 reps Supine Butterfly Groin Stretch - 1 x daily - 7 x weekly - 1 sets - 3 reps - 30-45 hold Supine Pelvic Floor Stretch - 1 x daily - 7 x weekly - 1 sets - 3 reps - 30-45 hold Half Kneeling Hip Flexor Stretch - 1 x daily - 7 x weekly - 1 sets - 3 reps - 30-45 hold Beginner Front Arm Support - 1 x daily - 3 x weekly - 2-3 sets - 5 reps Quadruped Hip Abduction and External Rotation - 1 x daily - 3 x weekly - 2-3 sets - 5 reps Reverse Crunch - 1 x daily - 3 x weekly - 2 sets - 10 reps Low Trap Setting at Wall - 1 x daily - 3 x weekly - 3 sets - 10 reps Supine Piriformis Stretch - 1 x daily - 7 x weekly - 3 sets - 10 reps

## 2021-06-25 NOTE — Therapy (Signed)
[place] MAIN [facility] SERVICES [address] [place], [place], [address] Phone: [phone]   Fax:  [phone]  Physical Therapy Treatment  Patient Details  Name: Erika [name] MRN: [id number] Date of Birth: 09-03-1975 Referring Provider (PT): McLean-Scocuzza, Nino Glow, MD   Encounter Date: 06/25/2021   PT End of Session - 06/25/21 1120     Visit Number 9    Number of Visits 12   original POC: 10 visits but pt only had 8.   Date for PT Re-Evaluation 07/27/21    Authorization Type UHC    Progress Note Due on Visit 10    PT Start Time 1007    PT Stop Time 1100   pt late   PT Time Calculation (min) 53 min    Activity Tolerance Patient tolerated treatment well    Behavior During Therapy WFL for tasks assessed/performed             Past Medical History:  Diagnosis Date   Allergy    seasonal    Past Surgical History:  Procedure Laterality Date   DILATION AND CURETTAGE OF UTERUS     WISDOM TOOTH EXTRACTION      There were no vitals filed for this visit.   Subjective Assessment - 06/25/21 1009     Subjective Pt reported she's been traveling a lot for personal time and work. Pt reported she's feeling good overall. Pt is still experiencing some leakage intermittently but was able to jump rope yesterday without leakage. Pt has been performing HEP but not as much. Pt has an appt. on 11/3 for mammogram and they will assess R axillary mass at that time. pt reported she felt better after last session and felt like she had more movement after that.    Pertinent History R axillary mass-benign and MD is following, perimenopause    Patient Stated Goals Improve leakage    Currently in Pain? No/denies             NMR: Access Code: [email or web address] URL: [email or web address] Date: 06/25/2021 Prepared by: Geoffry Paradise  Exercises Clamshell - 1 x daily - 4 x weekly - 3 sets - 10 reps (reviewed x10 reps with cues to use red band  at home). Seated Hamstring Stretch - 2-3 x daily - 7 x weekly - 1 sets - 3 reps Supine Pelvic Floor Contraction - 2 x daily - 7 x weekly - 1 sets - 10 reps - 10 hold, progressed to seated x10 reps, then during sit to stand txfs 2x5 reps.  Supine Angels - 1-2 x daily - 7 x weekly - 1 sets - 10 reps Supine Piriformis Stretch - 1 x daily - 7 x weekly - 3 sets - 10 reps knee to contralateral shoulder.  Added or modified HEP. Performed with demo and cues for proper technique and S for safety.                   [place] Adult PT Treatment/Exercise - 06/25/21 1125       Manual Therapy   Manual Therapy Soft tissue mobilization;Joint mobilization    Manual therapy comments Pt reported less tension after manual therapy.    Joint Mobilization PT performed PAs to T6-T8, grade 3 3x30sec. to improve ext, with pt performing supine angels after manual therapy. PT performed grade 3 PAs to sacrum/coccyx to improve ROM 3x30sec. PT performed B hip long and short axis distraction 3x30sec/axis/LE to improve hip ROM.    Soft tissue  mobilization PT performed STM and trigger point release to B hip adductors in supine with LE resting on PT's LE to decr. tension and pain, as adductor tightness can incr. tension in pelvic floor.    Muscle Energy Technique PT had pt perform contract/relax of B adductors x 3reps/LE followed by sustained stretch.                   SELF CARE:  PT Education - 06/25/21 1119     Education Details PT reviewed HEP, modified and progressed as indicated. PT encouraged pt to perform new supine piriformis stretch and hamstring stretch before, during and after upcoming international travel to reduce hip tightness. PT edcuated pt on coordination of pelvic floor contraction during sit to stand txfs.    Person(s) Educated Patient    Methods Explanation;Demonstration;Verbal cues;Handout    Comprehension Returned demonstration;Verbalized understanding;Need further instruction               PT Short Term Goals - 03/19/21 1011       PT SHORT TERM GOAL #1   Title Pt will be IND in HEP to decr. incontinence and improved strength. TARGET DATE FOR ALL STGS: 02/26/21    Baseline NO HEP    Time 5    Period Weeks    Status Achieved      PT SHORT TERM GOAL #2   Title Have pt complete FOTO and write goals.    Baseline 5    Period Weeks    Status Achieved               PT Long Term Goals - 05/28/21 1107       PT LONG TERM GOAL #1   Title Pt will be IND in progressed HEP to improve strength, coordination and incontinence. TARGET DATE FOR ALL LTGS: 07/23/21    Time 10    Period Weeks    Status On-going      PT LONG TERM GOAL #2   Title Pt will report demonstrate proper PF muscle contraction prior to coughing/sneezing to prevent leakage during functional mobility..    Baseline 04/09/21: has improved at least 60% per pt since beginning PT.    Time 10    Period Weeks    Status On-going      PT LONG TERM GOAL #3   Title Pt will demonstrate ability to hold PF contraction for >10 sec. in order to report no leaking while working out at [employer] activities (jumproping) to improve QOL.    Baseline 04/09/21: has improved approx. 60%    Time 10    Period Weeks    Status On-going      PT LONG TERM GOAL #4   Title Pt will improve FOTO score to >/=77 to improve QOL.    Baseline 72 urinary problems    Time 10    Period Weeks    Status On-going                   Plan - 06/25/21 1121     Clinical Impression Statement Pt demonstrated progress as she reported less leakage with high impact exercise (jump rope), however, leakage still occurs intermittently. Pt noted to have incr. hip tightness and B hip IR continues to be limited, along with tx spine (T6-T8) hypomobility. However, over tx spine hypomobility was improved. Pt required cues and demo to properly coordinate pelvic floor muscle (PFM) contraction during sit to stand txfs but was able to perform  PFM contraction in seated position, indicating progress. Pt would continue to benefit from skilled PT to improve SUI, pain, posture, strength, and flexibility during all activities.    Personal Factors and Comorbidities Comorbidity 2;Time since onset of injury/illness/exacerbation    Examination-Activity Limitations Locomotion Level;Lift;Carry;Continence    PT Frequency 1x / week    PT Duration --   1 visit every other week for 4 visits total.   PT Treatment/Interventions ADLs/Self Care Home Management;Stair training;Functional mobility training;Gait training;Therapeutic activities;Therapeutic exercise;Balance training;Neuromuscular re-education;Patient/family education;Joint Manipulations;Manual techniques;Spinal Manipulations    PT Next Visit Plan Pelvic tilts. PFM contraction with txfs and exercise. tx spine manipulation as indicated. SLS with stepping and PF contraction.paraspinal manual therapy, quadruped activities. as needed. R SLS-genu recurvatum?,    PT Home Exercise Plan Medbridge: [email or web address]    Consulted and Agree with Plan of Care Patient             Patient will benefit from skilled therapeutic intervention in order to improve the following deficits and impairments:  Decreased balance, Postural dysfunction, Impaired flexibility, Decreased strength, Decreased coordination  Visit Diagnosis: Stress incontinence (female) (female)  Muscle weakness (generalized)  Other lack of coordination     Problem List Patient Active Problem List   Diagnosis Date Noted   Axillary mass, right 10/04/2020   Microalbuminuria 03/11/2020   Positive ANA (antinuclear antibody) 03/06/2020   Proteinuria 03/06/2020   Bradycardia 02/08/2020   Annual physical exam 02/01/2019   Hives 02/01/2019   Perimenopausal 02/01/2019    Kaimana Neuzil L, PT 06/25/2021, 11:30 AM  [place] MAIN [facility] SERVICES [address] [place], [place], [address] Phone: [phone]    Fax:  [phone]  Name: Glennda Weatherholtz MRN: [id number] Date of Birth: Jun 30, 1976   Geoffry Paradise, PT,DPT 06/25/21 11:32 AM Phone: [phone] Fax: [phone]

## 2021-06-27 LAB — HM MAMMOGRAPHY

## 2021-07-09 ENCOUNTER — Ambulatory Visit: Payer: 59

## 2021-07-23 ENCOUNTER — Ambulatory Visit: Payer: 59

## 2021-07-23 ENCOUNTER — Other Ambulatory Visit: Payer: Self-pay

## 2021-07-23 DIAGNOSIS — N393 Stress incontinence (female) (male): Secondary | ICD-10-CM

## 2021-07-23 DIAGNOSIS — R278 Other lack of coordination: Secondary | ICD-10-CM

## 2021-07-23 DIAGNOSIS — M6281 Muscle weakness (generalized): Secondary | ICD-10-CM

## 2021-07-23 NOTE — Therapy (Signed)
[place] MAIN [facility] SERVICES [address] [place], [place], [address] Phone: [phone]   Fax:  [phone]  Physical Therapy Treatment  Patient Details  Name: Erika [name] MRN: [id number] Date of Birth: 30-Mar-1976 Referring Provider (PT): McLean-Scocuzza, Nino Glow, MD   Encounter Date: 07/23/2021   PT End of Session - 07/23/21 1231     Visit Number 10    Number of Visits 12    Date for PT Re-Evaluation 07/27/21    Authorization Type UHC    Progress Note Due on Visit 10    PT Start Time 0915   pt was not checked in   PT Stop Time 0959    PT Time Calculation (min) 44 min    Activity Tolerance Patient tolerated treatment well    Behavior During Therapy [facility] for tasks assessed/performed             Past Medical History:  Diagnosis Date   Allergy    seasonal    Past Surgical History:  Procedure Laterality Date   DILATION AND CURETTAGE OF UTERUS     WISDOM TOOTH EXTRACTION      There were no vitals filed for this visit.   Subjective Assessment - 07/23/21 0919     Subjective Pt reported work has been stressful with the end of the year approaching, so she hasn't been doing HEP as frequently. She's been traveling a lot for work. She reported the last two weeks she's experiencing more leakage, not sure if it's because she hasn't been doing HEP. It has occurred during running and pulling up to the bar, and more leakage with jumprope. She's also having more difficulty coordinating pelvic floor contractions. Pt is still getting sport massages.    Pertinent History R axillary mass-benign and MD is following, perimenopause    Patient Stated Goals Improve leakage    Currently in Pain? No/denies                NMR: Access Code: [email or web address] URL: [email or web address] Date: 07/23/2021 Prepared by: Geoffry Paradise  Exercises Diaphragmatic Breathing at 90/90 Supported - 3 x daily - 7 x weekly - 1 sets - 5  reps Supine Pelvic Floor Contraction - 2 x daily - 7 x weekly - 1 sets - 10 reps - 10 hold, decr. To 5 sec. Hold with breath in between reps. Sidelying Open Book - 1 x daily - 7 x weekly - 1 sets - 10 reps reviewed Sidelying Feet Elevated Clamshells - 1 x daily - 4 x weekly - 3 sets - 10 reps reviewed and modified to incr. Speed if no band available at gym. Supine Angels - 1-2 x daily - 7 x weekly - 1 sets - 10 reps reviewed Low Trap Setting at [place] - 1 x daily - 3 x weekly - 3 sets - 10 reps R and [name] SLS stepping activity (pt stepping in ant/lat/post directions x5reps/LE) with inhalation upon step and then exhale with PFM contraction to SLS.  Performed or reviewed with modifications as pelvic floor muscles were weaker (endurance wise) 2/2 pt not performing HEP over the last few weeks 2/2 busy schedule. PT demonstrated proper technique for all reviewed HEP.           NMR as no manual performed or trigger points noted.   Pelvic Floor Special Questions - 07/23/21 0935     Pelvic Floor Internal Exam yes    Exam Type Vaginal    Sensation intact  Palpation no TTP    Strength good squeeze, good lift, able to hold agaisnt strong resistance    Strength # of reps 10    Strength # of seconds 3    Tone Tone WNL                        PT Education - 07/23/21 1230     Education Details PT reviewed, added, and modified HEP as indicated. PT educated pt on exam findings during internal muscle assessment and the importance of performing PFM contractions to reduce leakage, as pt has not been performing 2/2 busy schedule.    Person(s) Educated Patient    Methods Explanation;Demonstration;Tactile cues;Verbal cues;Handout    Comprehension Returned demonstration;Verbalized understanding              PT Short Term Goals - 03/19/21 1011       PT SHORT TERM GOAL #1   Title Pt will be IND in HEP to decr. incontinence and improved strength. TARGET DATE FOR ALL STGS: 02/26/21     Baseline NO HEP    Time 5    Period Weeks    Status Achieved      PT SHORT TERM GOAL #2   Title Have pt complete FOTO and write goals.    Baseline 5    Period Weeks    Status Achieved               PT Long Term Goals - 05/28/21 1107       PT LONG TERM GOAL #1   Title Pt will be IND in progressed HEP to improve strength, coordination and incontinence. TARGET DATE FOR ALL LTGS: 07/23/21    Time 10    Period Weeks    Status On-going      PT LONG TERM GOAL #2   Title Pt will report demonstrate proper PF muscle contraction prior to coughing/sneezing to prevent leakage during functional mobility..    Baseline 04/09/21: has improved at least 60% per pt since beginning PT.    Time 10    Period Weeks    Status On-going      PT LONG TERM GOAL #3   Title Pt will demonstrate ability to hold PF contraction for >10 sec. in order to report no leaking while working out at [employer] activities (jumproping) to improve QOL.    Baseline 04/09/21: has improved approx. 60%    Time 10    Period Weeks    Status On-going      PT LONG TERM GOAL #4   Title Pt will improve FOTO score to >/=77 to improve QOL.    Baseline 72 urinary problems    Time 10    Period Weeks    Status On-going                   Plan - 07/23/21 1231     Clinical Impression Statement Pt demonstrating progress overall with decr. tension and pain in pelvic floor during internal muscle assessment. However, pt reported incr. in SUI over the last few weeks, which is likely 2/2 pt not performing HEP over last few weeks due to busy work and personal schedule. Pt's pelvic floor muscle (PFM) quick contraction strength the same but decr. endurance noted during long hold. Pt did tolerate SLS stepping activities with PFM contraction well with postural sway decr. with proper PFM contraction. Incr. sway during R SLS > [name] SLS noted. Pt would continue  to benefit from skilled PT to improve SUI, strength, posture, and  flexibility. Pt is making progress towards LTGs.    PT Treatment/Interventions ADLs/Self Care Home Management;Stair training;Functional mobility training;Gait training;Therapeutic activities;Therapeutic exercise;Balance training;Neuromuscular re-education;Patient/family education;Joint Manipulations;Manual techniques;Spinal Manipulations    PT Next Visit Plan Pelvic tilts. PFM contraction with txfs and exercise. tx spine manipulation as indicated. SLS with stepping and PF contraction.paraspinal manual therapy, quadruped activities. as needed. R SLS-genu recurvatum?,    PT Home Exercise Plan Medbridge: [email or web address]    Consulted and Agree with Plan of Care Patient             Patient will benefit from skilled therapeutic intervention in order to improve the following deficits and impairments:  Decreased balance, Postural dysfunction, Impaired flexibility, Decreased strength, Decreased coordination  Visit Diagnosis: Stress incontinence (female) (female)  Muscle weakness (generalized)  Other lack of coordination     Problem List Patient Active Problem List   Diagnosis Date Noted   Axillary mass, right 10/04/2020   Microalbuminuria 03/11/2020   Positive ANA (antinuclear antibody) 03/06/2020   Proteinuria 03/06/2020   Bradycardia 02/08/2020   Annual physical exam 02/01/2019   Hives 02/01/2019   Perimenopausal 02/01/2019    Erika [name], PT 07/23/2021, 12:35 PM  [place] MAIN [facility] SERVICES [address] [place], [place], [address] Phone: [phone]   Fax:  [phone]  Name: Erika [name] MRN: [id number] Date of Birth: [birth year]  Progress Note Reporting Period 01/22/21 to 07/23/21  See note above for Objective Data and Assessment of Progress/Goals.      Geoffry Paradise, PT,DPT 07/23/21 12:35 PM Phone: [phone] Fax: [phone]

## 2021-07-26 LAB — HM COLONOSCOPY

## 2021-07-29 ENCOUNTER — Other Ambulatory Visit: Payer: Self-pay

## 2021-07-29 ENCOUNTER — Ambulatory Visit: Payer: 59 | Attending: Internal Medicine

## 2021-07-29 DIAGNOSIS — R278 Other lack of coordination: Secondary | ICD-10-CM | POA: Insufficient documentation

## 2021-07-29 DIAGNOSIS — N393 Stress incontinence (female) (male): Secondary | ICD-10-CM | POA: Diagnosis not present

## 2021-07-29 DIAGNOSIS — M6281 Muscle weakness (generalized): Secondary | ICD-10-CM | POA: Insufficient documentation

## 2021-07-29 NOTE — Therapy (Signed)
[place] MAIN [facility] SERVICES [address] [place], [place], [address] Phone: [phone]   Fax:  [phone]  Physical Therapy Treatment  Patient Details  Name: Erika [name] MRN: [id number] Date of Birth: 05/26/1976 Referring Provider (PT): McLean-Scocuzza, Nino Glow, MD   Encounter Date: 07/29/2021   PT End of Session - 07/29/21 1013     Visit Number 11    Number of Visits 12    Date for PT Re-Evaluation 07/27/21    Authorization Type UHC    Progress Note Due on Visit 10    PT Start Time 0904    PT Stop Time 1002    PT Time Calculation (min) 58 min    Activity Tolerance Patient tolerated treatment well    Behavior During Therapy [facility] for tasks assessed/performed             Past Medical History:  Diagnosis Date   Allergy    seasonal    Past Surgical History:  Procedure Laterality Date   DILATION AND CURETTAGE OF UTERUS     WISDOM TOOTH EXTRACTION      There were no vitals filed for this visit.   Subjective Assessment - 07/29/21 0906     Subjective Pt hasn't noticed leakage since last visit, and has performed running activities but no jump rope or pull up to the bar.    Pertinent History R axillary mass-benign and MD is following, perimenopause    Patient Stated Goals Improve leakage    Currently in Pain? No/denies              NMR: Standing Shoulder PNF D2 with Trunk Rotation - 1 x daily - 3 x weekly - 2 sets - 10 reps Standing Single Arm Shoulder PNF D1 Flexion with Anchored Resistance - 1 x daily - 3 x weekly - 2 sets - 10 reps Performed with demo, verbal and manual cues for proper technique and red band. S for safety. Provided as HEP to improve trunk rotation and back/shoulder [name].                 [place] Adult PT Treatment/Exercise - 07/29/21 1017       Neuro Re-ed    Neuro Re-ed Details  Pt performed R and [name] SLS for approx. 5 sec. with PT assessing for genu recurvatum, none noted. PT  had pt perform diaphragmatic breathing with emphasis on expanding in lateral directions with PT palpating R and [name] ribcage with pt in supine after manual therapy.      Manual Therapy   Manual Therapy Joint mobilization;Internal Pelvic Floor    Manual therapy comments Pt reported less tension after manual therapy and improvement in diaphragm expansion noted.    Joint Mobilization PT performed PA joint mobs to T4-T8 Grade 3, and PA joint mobs Grades 3-4 at [name] costovertebral joints (T4-T8) to improve ROM. PT assessed B ankle joints with ROM WFL and no pain noted.    Internal Pelvic Floor PT performed trigger point release to B levator ani and [name] obutrator internus to decr. tension and improve ability to perform endurance PFM for incr. time.                     PT Education - 07/29/21 1012     Education Details PT discussed d/c likely next session, based on progress. PT provided pt with PNF HEP to improve tx spine rot and shoulder/back muscle [name], as this can impact pelvic floor  as it incr. hypomobility and incr. trigger points in spine and muscles which can lead to incr. tension in pelvic floor.    Person(s) Educated Patient    Methods Explanation;Demonstration;Tactile cues;Verbal cues;Handout    Comprehension Returned demonstration;Need further instruction;Verbalized understanding              PT Short Term Goals - 03/19/21 1011       PT SHORT TERM GOAL #1   Title Pt will be IND in HEP to decr. incontinence and improved [name]. TARGET DATE FOR ALL STGS: 02/26/21    Baseline NO HEP    Time 5    Period Weeks    Status Achieved      PT SHORT TERM GOAL #2   Title Have pt complete FOTO and write goals.    Baseline 5    Period Weeks    Status Achieved               PT Long Term Goals - 05/28/21 1107       PT LONG TERM GOAL #1   Title Pt will be IND in progressed HEP to improve [name], coordination and incontinence. TARGET DATE FOR ALL LTGS: 07/23/21    Time  10    Period Weeks    Status On-going      PT LONG TERM GOAL #2   Title Pt will report demonstrate proper PF muscle contraction prior to coughing/sneezing to prevent leakage during functional mobility..    Baseline 04/09/21: has improved at least 60% per pt since beginning PT.    Time 10    Period Weeks    Status On-going      PT LONG TERM GOAL #3   Title Pt will demonstrate ability to hold PF contraction for >10 sec. in order to report no leaking while working out at [employer] activities (jumproping) to improve QOL.    Baseline 04/09/21: has improved approx. 60%    Time 10    Period Weeks    Status On-going      PT LONG TERM GOAL #4   Title Pt will improve FOTO score to >/=77 to improve QOL.    Baseline 72 urinary problems    Time 10    Period Weeks    Status On-going                   Plan - 07/29/21 1013     Clinical Impression Statement During internal muscle assessment pt did have incr. in R levator ani and [name] obturator internus musculature, which decr. after trigger point release. Pt was then able to improve endurance contraction for 3 sec. hold to 8 sec. hold with coordination of gentle breaths and decr. tension in pelvic floor. Hypomobility noted Tx spine and [name] posterior ribs along T4-T8, which improved after manual therapy. Pt demonstrated progress, as she was able to tolerate progression of HEP to PNF patterns with resistance to improve shoulder/back [name] and tx spine rotation. PT will assess goals next session and likely d/c, asking for one additional visit as pt missed appt's 2/2 travel, illness and PT being out.    PT Treatment/Interventions ADLs/Self Care Home Management;Stair training;Functional mobility training;Gait training;Therapeutic activities;Therapeutic exercise;Balance training;Neuromuscular re-education;Patient/family education;Joint Manipulations;Manual techniques;Spinal Manipulations    PT Next Visit Plan Check LTGs and d/c likely.    PT Home  Exercise Plan Medbridge: [email or web address]             Patient will benefit from skilled therapeutic intervention in order to  improve the following deficits and impairments:  Decreased balance, Postural dysfunction, Impaired flexibility, Decreased [name], Decreased coordination  Visit Diagnosis: Stress incontinence (female) (female)  Muscle weakness (generalized)  Other lack of coordination     Problem List Patient Active Problem List   Diagnosis Date Noted   Axillary mass, right 10/04/2020   Microalbuminuria 03/11/2020   Positive ANA (antinuclear antibody) 03/06/2020   Proteinuria 03/06/2020   Bradycardia 02/08/2020   Annual physical exam 02/01/2019   Hives 02/01/2019   Perimenopausal 02/01/2019    Erika [name], PT 07/29/2021, 10:23 AM  [place] MAIN [facility] SERVICES [address] [place], [place], [address] Phone: [phone]   Fax:  [phone]  Name: Erika [name] MRN: [id number] Date of Birth: [birth year]   Geoffry Paradise, PT,DPT 07/29/21 10:24 AM Phone: [phone] Fax: [phone]

## 2021-07-29 NOTE — Patient Instructions (Signed)
Access Code: [email or web address] URL: [email or web address] Date: 07/29/2021  Standing Shoulder PNF D2 with Trunk Rotation - 1 x daily - 3 x weekly - 2 sets - 10 reps Standing Single Arm Shoulder PNF D1 Flexion with Anchored Resistance - 1 x daily - 3 x weekly - 2 sets - 10 reps

## 2021-08-13 ENCOUNTER — Other Ambulatory Visit: Payer: Self-pay

## 2021-08-13 ENCOUNTER — Ambulatory Visit: Payer: 59

## 2021-08-13 DIAGNOSIS — M6281 Muscle weakness (generalized): Secondary | ICD-10-CM

## 2021-08-13 DIAGNOSIS — N393 Stress incontinence (female) (male): Secondary | ICD-10-CM | POA: Diagnosis not present

## 2021-08-13 DIAGNOSIS — R278 Other lack of coordination: Secondary | ICD-10-CM

## 2021-08-13 NOTE — Therapy (Signed)
[place] MAIN [facility] SERVICES [address] [place], [place], [address] Phone: [phone]   Fax:  [phone]  Physical Therapy Treatment/Discharge  Patient Details  Name: Erika [name] MRN: [id number] Date of Birth: [birth year] Referring Provider (PT): McLean-Scocuzza, Nino Glow, MD   Encounter Date: 08/13/2021   PT End of Session - 08/13/21 0932     Visit Number 12    Number of Visits 12    Date for PT Re-Evaluation 07/27/21    Authorization Type UHC    Progress Note Due on Visit 10    PT Start Time 0901    PT Stop Time 0932   d/c, pt completeing FOTO [phone]   PT Time Calculation (min) 31 min    Activity Tolerance Patient tolerated treatment well    Behavior During Therapy [facility] for tasks assessed/performed             Past Medical History:  Diagnosis Date   Allergy    seasonal    Past Surgical History:  Procedure Laterality Date   DILATION AND CURETTAGE OF UTERUS     WISDOM TOOTH EXTRACTION      There were no vitals filed for this visit.   Subjective Assessment - 08/13/21 0904     Subjective Pt reported HEP and leakage has been better and everything has been going well.    Pertinent History R axillary mass-benign and MD is following, perimenopause    Patient Stated Goals Improve leakage    Currently in Pain? No/denies               NMR: reviewed HEP Access Code: [email or web address] URL: [email or web address] Date: 08/13/2021 Prepared by: Geoffry Paradise  Exercises Clamshell - 1 x daily - 4 x weekly - 3 sets - 10 reps Diaphragmatic Breathing at 90/90 Supported - 3 x daily - 7 x weekly - 1 sets - 5 reps Seated Hamstring Stretch - 2-3 x daily - 7 x weekly - 1 sets - 3 reps Supine Pelvic Floor Contraction - 2 x daily - 7 x weekly - 1 sets - 10 reps - 10 hold Supine Piriformis Stretch - 1 x daily - 7 x weekly - 1 sets - 3 reps - 30-60 hold Seated Table Piriformis Stretch - 1 x daily - 7 x weekly - 3  sets - 10 reps Supine Lower Trunk Rotation - 2 x daily - 7 x weekly - 1 sets - 3 reps - 60 hold Sidelying Open Book - 1 x daily - 7 x weekly - 1 sets - 10 reps Sidelying Feet Elevated Clamshells - 1 x daily - 4 x weekly - 3 sets - 10 reps Supine Angels - 1-2 x daily - 7 x weekly - 1 sets - 10 reps Supine Butterfly Groin Stretch - 1 x daily - 7 x weekly - 1 sets - 3 reps - 30-45 hold Supine Pelvic Floor Stretch - 1 x daily - 7 x weekly - 1 sets - 3 reps - 30-45 hold Half Kneeling Hip Flexor Stretch - 1 x daily - 7 x weekly - 1 sets - 3 reps - 30-45 hold Beginner Front Arm Support - 1 x daily - 3 x weekly - 2-3 sets - 5 reps Quadruped Hip Abduction and External Rotation - 1 x daily - 3 x weekly - 2-3 sets - 5 reps Reverse Crunch - 1 x daily - 3 x weekly - 2 sets - 10 reps Low Trap Setting at [place] - 1  x daily - 3 x weekly - 3 sets - 10 reps Supine Piriformis Stretch - 1 x daily - 7 x weekly - 3 sets - 10 reps Standing Single Arm Shoulder PNF D1 Flexion with Anchored Resistance - 1 x daily - 7 x weekly - 3 sets - 10 reps Standing Shoulder PNF D2 with Trunk Rotation - 1 x daily - 3 x weekly - 2 sets - 10 reps Standing Single Arm Shoulder PNF D1 Flexion with Anchored Resistance - 1 x daily - 3 x weekly - 2 sets - 10 reps  Pt also performed forearm and full planks (with and without rocking), side planks with and without dips with breath/PFM (pelvic floor muscle) contraction coordination. Cues and demo for technique. PT demonstrated child's pose and happy baby pose to relax pelvic floor with cues to lengthen pelvic floor with inhalation-perform to decr. PFM tension.   Diastasis recti: 1 finger width 1" sup. To umbilicus, 0.5 finger width inf. To umbilicus.  FOTO: 24 urinary problems                            PT Education - 08/13/21 0931     Education Details PT educated pt on ways to progress exercises at gym with core engaged and pelvic floor musculature. PT discussed  outcome measure results.    Person(s) Educated Patient    Methods Explanation;Demonstration;Verbal cues;Handout    Comprehension Returned demonstration;Verbalized understanding              PT Short Term Goals - 03/19/21 1011       PT SHORT TERM GOAL #1   Title Pt will be IND in HEP to decr. incontinence and improved strength. TARGET DATE FOR ALL STGS: 02/26/21    Baseline NO HEP    Time 5    Period Weeks    Status Achieved      PT SHORT TERM GOAL #2   Title Have pt complete FOTO and write goals.    Baseline 5    Period Weeks    Status Achieved               PT Long Term Goals - 08/13/21 0906       PT LONG TERM GOAL #1   Title Pt will be IND in progressed HEP to improve strength, coordination and incontinence. TARGET DATE FOR ALL LTGS: 07/23/21    Time 10    Period Weeks    Status Achieved      PT LONG TERM GOAL #2   Title Pt will report demonstrate proper PF muscle contraction prior to coughing/sneezing to prevent leakage during functional mobility..    Baseline 04/09/21: has improved at least 60% per pt since beginning PT. 12/20: pt reported she's approx. 75% improved since beginning PT.    Time 10    Period Weeks    Status Achieved      PT LONG TERM GOAL #3   Title Pt will demonstrate ability to hold PF contraction for >10 sec. in order to report no leaking while working out at [employer] activities (jumproping) to improve QOL.    Baseline 04/09/21: has improved approx. 60%. 12/20: pt can hold for approx. 8 sec. and verbalizes strategies to coordinate breath    Time 10    Period Weeks    Status Partially Met      PT LONG TERM GOAL #4   Title Pt will improve FOTO score to >/=  77 to improve QOL.    Baseline 72 urinary problems, 12/20: 64 but pt reported she feels better overall    Time 10    Period Weeks    Status Not Met                   Plan - 08/13/21 0942     Clinical Impression Statement Today focused on assessing LTGs for d/c as pt  reported she feels 75% better overall. Pt demonstrated improvement as she met LTGs 1 and 2, partially met LTG 3 but did not meet LTG 4 (FOTO). Pt's diastatsis recti improved significantly and pt verbalized and demonstrated ways to reduce leakage, therefore, pt is discharging today. See d/c summary for details.    PT Next Visit Plan d/c             Patient will benefit from skilled therapeutic intervention in order to improve the following deficits and impairments:     Visit Diagnosis: Stress incontinence (female) (female)  Muscle weakness (generalized)  Other lack of coordination     Problem List Patient Active Problem List   Diagnosis Date Noted   Axillary mass, right 10/04/2020   Microalbuminuria 03/11/2020   Positive ANA (antinuclear antibody) 03/06/2020   Proteinuria 03/06/2020   Bradycardia 02/08/2020   Annual physical exam 02/01/2019   Hives 02/01/2019   Perimenopausal 02/01/2019    Maaz Spiering L, PT 08/13/2021, 9:44 AM  [place] MAIN [facility] SERVICES [address] [place], [place], [address] Phone: [phone]   Fax:  [phone]  Name: Erika [name] MRN: [id number] Date of Birth: Nov 25, 1975   PHYSICAL THERAPY DISCHARGE SUMMARY  Visits from Start of Care: 12  Current functional level related to goals / functional outcomes:  PT Long Term Goals - 08/13/21 0906       PT LONG TERM GOAL #1   Title Pt will be IND in progressed HEP to improve strength, coordination and incontinence. TARGET DATE FOR ALL LTGS: 07/23/21    Time 10    Period Weeks    Status Achieved      PT LONG TERM GOAL #2   Title Pt will report demonstrate proper PF muscle contraction prior to coughing/sneezing to prevent leakage during functional mobility..    Baseline 04/09/21: has improved at least 60% per pt since beginning PT. 12/20: pt reported she's approx. 75% improved since beginning PT.    Time 10    Period Weeks    Status Achieved       PT LONG TERM GOAL #3   Title Pt will demonstrate ability to hold PF contraction for >10 sec. in order to report no leaking while working out at [employer] activities (jumproping) to improve QOL.    Baseline 04/09/21: has improved approx. 60%. 12/20: pt can hold for approx. 8 sec. and verbalizes strategies to coordinate breath    Time 10    Period Weeks    Status Partially Met      PT LONG TERM GOAL #4   Title Pt will improve FOTO score to >/=77 to improve QOL.    Baseline 72 urinary problems, 12/20: 64 but pt reported she feels better overall    Time 10    Period Weeks    Status Not Met               Remaining deficits: Intermittent leakage.   Education / Equipment: HEP   Patient agrees to discharge. Patient goals were met. Patient is being  discharged due to being pleased with the current functional level.   Geoffry Paradise, PT,DPT 08/13/21 9:48 AM Phone: [phone] Fax: [phone]

## 2021-11-12 ENCOUNTER — Other Ambulatory Visit: Payer: Self-pay | Admitting: Nephrology

## 2021-11-12 DIAGNOSIS — R809 Proteinuria, unspecified: Secondary | ICD-10-CM

## 2021-11-12 DIAGNOSIS — R768 Other specified abnormal immunological findings in serum: Secondary | ICD-10-CM

## 2021-11-13 ENCOUNTER — Other Ambulatory Visit: Payer: Self-pay

## 2021-11-13 ENCOUNTER — Ambulatory Visit: Payer: 59 | Admitting: Family Medicine

## 2021-11-13 ENCOUNTER — Ambulatory Visit: Payer: Self-pay

## 2021-11-13 VITALS — BP 108/70 | HR 88 | Ht 64.0 in | Wt 146.0 lb

## 2021-11-13 DIAGNOSIS — S76302A Unspecified injury of muscle, fascia and tendon of the posterior muscle group at thigh level, left thigh, initial encounter: Secondary | ICD-10-CM

## 2021-11-13 NOTE — Patient Instructions (Addendum)
Thank you for coming in today.  ? ?The [place] L Protocol for Hamstring Strains  Lyman Bishop PT ? ?Nordic hamstring Curls ? ?Ischial tuberosity off loader cushion.  ? ?If not better PT.  ? ?If still not better injection ?

## 2021-11-13 NOTE — Progress Notes (Signed)
? ?  I, Erika [name], LAT, ATC acting as a scribe for Erika Leader, MD. ? ?Subjective:   ? ?CC: L hamstring pain ? ?HPI: Pt is a 46 y/o female c/o L hamstring pain ongoing since the beginning of Jan. Pt notes tightness through piriformis and glute on the L side. Pt does cross fit and recalls the pain starting after a jump rope workout, but no specific instance. Pt locates pain to midline of the L thigh w/ pain in to the L buttock.  She also has some pain especially at her ischial tuberosity with sitting. ? ?Aggravates: forward trunk flex, sitting ?Treatments tried: ice, heat, massage, stretches ? ? ?Pertinent review of Systems: No fevers or chills ? ?Relevant historical information: Perimenopausal ? ? ?Objective:   ? ?Vitals:  ? 11/13/21 1456  ?BP: 108/70  ?Pulse: 88  ?SpO2: 98%  ? ?General: Well Developed, well nourished, and in no acute distress.  ? ?MSK: Left hip normal-appearing ?Normal motion. ?Tender palpation ischial tuberosity. ?Hip abduction strength slightly diminished 4+/5.  External rotation strength is intact. ?Extension strength is intact but somewhat painful. ?Distal muscle belly hamstring tender to palpation. ?Pain with resisted knee flexion. ?Positive H test. ? ? ? ? ? ?Impression and Recommendations:   ? ?Assessment and Plan: ?46 y.o. female with left hamstring strain and pain at the hamstring origin ischial tuberosity.  Reviewed home exercise program for including esculin protocol leading towards Nordic curls.  Also recommend ischial tuberosity offloader cushion.  If this is not sufficient can refer her to physical therapy.  Her sons go to be going to physical therapy in [place] so she could probably go to the same location. ?She will let me know.  If not better we could even do an injection after physical therapy. ? ? ?Discussed warning signs or symptoms. Please see discharge instructions. Patient expresses understanding. ? ? ?The above documentation has been reviewed and is accurate and  complete Erika [name], M.D. ? ?

## 2021-11-19 ENCOUNTER — Other Ambulatory Visit: Payer: 59

## 2021-11-20 ENCOUNTER — Other Ambulatory Visit: Payer: Self-pay

## 2021-11-20 ENCOUNTER — Ambulatory Visit
Admission: RE | Admit: 2021-11-20 | Discharge: 2021-11-20 | Disposition: A | Discharge status: Home / Self Care | Payer: 59 | Source: Ambulatory Visit | Attending: Nephrology | Admitting: Nephrology

## 2021-11-20 DIAGNOSIS — R809 Proteinuria, unspecified: Secondary | ICD-10-CM

## 2021-11-20 DIAGNOSIS — R768 Other specified abnormal immunological findings in serum: Secondary | ICD-10-CM

## 2022-02-12 ENCOUNTER — Encounter: Payer: 59 | Admitting: Internal Medicine

## 2022-02-27 ENCOUNTER — Encounter: Payer: 59 | Admitting: Internal Medicine

## 2022-03-20 ENCOUNTER — Encounter: Payer: Self-pay | Admitting: Internal Medicine

## 2022-03-20 ENCOUNTER — Ambulatory Visit (INDEPENDENT_AMBULATORY_CARE_PROVIDER_SITE_OTHER): Payer: 59 | Admitting: Internal Medicine

## 2022-03-20 VITALS — BP 120/70 | HR 65 | Temp 98.0°F | Ht 64.0 in | Wt 149.4 lb

## 2022-03-20 DIAGNOSIS — E559 Vitamin D deficiency, unspecified: Secondary | ICD-10-CM

## 2022-03-20 DIAGNOSIS — Z1389 Encounter for screening for other disorder: Secondary | ICD-10-CM

## 2022-03-20 DIAGNOSIS — Z0000 Encounter for general adult medical examination without abnormal findings: Secondary | ICD-10-CM

## 2022-03-20 DIAGNOSIS — Z13220 Encounter for screening for lipoid disorders: Secondary | ICD-10-CM | POA: Diagnosis not present

## 2022-03-20 DIAGNOSIS — E538 Deficiency of other specified B group vitamins: Secondary | ICD-10-CM

## 2022-03-20 DIAGNOSIS — E611 Iron deficiency: Secondary | ICD-10-CM

## 2022-03-20 DIAGNOSIS — Z1329 Encounter for screening for other suspected endocrine disorder: Secondary | ICD-10-CM

## 2022-03-20 DIAGNOSIS — R5383 Other fatigue: Secondary | ICD-10-CM

## 2022-03-20 DIAGNOSIS — E785 Hyperlipidemia, unspecified: Secondary | ICD-10-CM | POA: Insufficient documentation

## 2022-03-20 LAB — URINALYSIS, ROUTINE W REFLEX MICROSCOPIC
Bilirubin Urine: NEGATIVE
Hgb urine dipstick: NEGATIVE
Ketones, ur: NEGATIVE
Leukocytes,Ua: NEGATIVE
Nitrite: NEGATIVE
Specific Gravity, Urine: <=1.005 — AB (ref 1.000–1.030)
Total Protein, Urine: NEGATIVE
Urine Glucose: NEGATIVE
Urobilinogen, UA: 0.2 (ref 0.0–1.0)
pH: 6 (ref 5.0–8.0)

## 2022-03-20 LAB — COMPREHENSIVE METABOLIC PANEL
ALT: 18 U/L (ref 0–35)
AST: 33 U/L (ref 0–37)
Albumin: 4.3 g/dL (ref 3.5–5.2)
Alkaline Phosphatase: 84 U/L (ref 39–117)
BUN: 20 mg/dL (ref 6–23)
CO2: 29 mEq/L (ref 19–32)
Calcium: 9.3 mg/dL (ref 8.4–10.5)
Chloride: 101 mEq/L (ref 96–112)
Creatinine, Ser: 0.92 mg/dL (ref 0.40–1.20)
GFR: 74.54 mL/min (ref >60.00)
Glucose, Bld: 79 mg/dL (ref 70–99)
Potassium: 3.9 mEq/L (ref 3.5–5.1)
Sodium: 136 mEq/L (ref 135–145)
Total Bilirubin: 0.6 mg/dL (ref 0.2–1.2)
Total Protein: 7 g/dL (ref 6.0–8.3)

## 2022-03-20 LAB — CBC WITH DIFFERENTIAL/PLATELET
Basophils Absolute: 0 10*3/uL (ref 0.0–0.1)
Basophils Relative: 0.5 % (ref 0.0–3.0)
Eosinophils Absolute: 0.1 10*3/uL (ref 0.0–0.7)
Eosinophils Relative: 0.9 % (ref 0.0–5.0)
HCT: 38.2 % (ref 36.0–46.0)
Hemoglobin: 12.8 g/dL (ref 12.0–15.0)
Lymphocytes Relative: 31.7 % (ref 12.0–46.0)
Lymphs Abs: 2.3 10*3/uL (ref 0.7–4.0)
MCHC: 33.5 g/dL (ref 30.0–36.0)
MCV: 90.2 fl (ref 78.0–100.0)
Monocytes Absolute: 0.7 10*3/uL (ref 0.1–1.0)
Monocytes Relative: 9.4 % (ref 3.0–12.0)
Neutro Abs: 4.1 10*3/uL (ref 1.4–7.7)
Neutrophils Relative %: 57.5 % (ref 43.0–77.0)
Platelets: 279 10*3/uL (ref 150.0–400.0)
RBC: 4.23 Mil/uL (ref 3.87–5.11)
RDW: 13.5 % (ref 11.5–15.5)
WBC: 7.1 10*3/uL (ref 4.0–10.5)

## 2022-03-20 LAB — LIPID PANEL
Cholesterol: 210 mg/dL — ABNORMAL HIGH (ref 0–200)
HDL: 57.6 mg/dL (ref >39.00)
LDL Cholesterol: 128 mg/dL — ABNORMAL HIGH (ref 0–99)
NonHDL: 152.18
Total CHOL/HDL Ratio: 4
Triglycerides: 123 mg/dL (ref 0.0–149.0)
VLDL: 24.6 mg/dL (ref 0.0–40.0)

## 2022-03-20 LAB — VITAMIN D 25 HYDROXY (VIT D DEFICIENCY, FRACTURES): VITD: 21.16 ng/mL — ABNORMAL LOW (ref 30.00–100.00)

## 2022-03-20 LAB — IBC + FERRITIN
Ferritin: 43.6 ng/mL (ref 10.0–291.0)
Iron: 82 ug/dL (ref 42–145)
Saturation Ratios: 22.6 % (ref 20.0–50.0)
TIBC: 362.6 ug/dL (ref 250.0–450.0)
Transferrin: 259 mg/dL (ref 212.0–360.0)

## 2022-03-20 LAB — T4, FREE: Free T4: 0.68 ng/dL (ref 0.60–1.60)

## 2022-03-20 LAB — VITAMIN B12: Vitamin B-12: 330 pg/mL (ref 211–911)

## 2022-03-20 LAB — TSH: TSH: 1.25 u[IU]/mL (ref 0.35–5.50)

## 2022-03-20 NOTE — Progress Notes (Signed)
Chief Complaint  Patient presents with   Annual Exam   Annual  1. C/o fatigue not sleeping well due to husband snoring sleeping 7-8 hours interrupted and increased stress with working at [employer] and reduce sex drive will f/u [facility] in 04/2022   Review of Systems  Constitutional:  Negative for weight loss.  HENT:  Negative for hearing loss.   Eyes:  Negative for blurred vision.  Respiratory:  Negative for shortness of breath.   Cardiovascular:  Negative for chest pain.  Gastrointestinal:  Negative for abdominal pain and blood in stool.  Genitourinary:  Negative for dysuria.  Musculoskeletal:  Negative for falls and joint pain.  Skin:  Negative for rash.  Neurological:  Negative for headaches.  Psychiatric/Behavioral:  Negative for depression.    Past Medical History:  Diagnosis Date   Allergy    seasonal   Past Surgical History:  Procedure Laterality Date   DILATION AND CURETTAGE OF UTERUS     WISDOM TOOTH EXTRACTION     Family History  Problem Relation Age of Onset   Diabetes Maternal Aunt    Ovarian cancer Maternal Grandmother    Prostate cancer Maternal Grandfather    ADD / ADHD Daughter        40 y.o as of 02/08/20   Social History   Socioeconomic History   Marital status: Married    Spouse name: Not on file   Number of children: Not on file   Years of education: Not on file   Highest education level: Not on file  Occupational History   Not on file  Tobacco Use   Smoking status: Never   Smokeless tobacco: Never  Substance and Sexual Activity   Alcohol use: Yes    Alcohol/week: 0.0 - 1.0 standard drinks of alcohol   Drug use: No   Sexual activity: Yes    Partners: Male  Other Topics Concern   Not on file  Social History Narrative   Works Ship broker in [place]    Married 2 kids boy 46 y.o and girl 9 y.o 12/22/17    Social Determinants of Health   Financial Resource Strain: Not on file  Food Insecurity: Not on file  Transportation Needs: Not on file   Physical Activity: Not on file  Stress: Not on file  Social Connections: Not on file  Intimate Partner Violence: Not on file   No outpatient medications have been marked as taking for the 03/20/22 encounter (Office Visit) with McLean-Scocuzza, Nino Glow, MD.   Allergies  Allergen Reactions   Flonase [Fluticasone] Rash   Penicillins Other (See Comments)   No results found for this or any previous visit (from the past 2160 hour(s)). Objective  Body mass index is 25.64 kg/m. Wt Readings from Last 3 Encounters:  03/20/22 149 lb 6.4 oz (67.8 kg)  11/13/21 146 lb (66.2 kg)  02/07/21 139 lb 9.6 oz (63.3 kg)   Temp Readings from Last 3 Encounters:  03/20/22 98 F (36.7 C) (Oral)  02/07/21 97.6 F (36.4 C) (Oral)  09/27/20 (!) 97.5 F (36.4 C)   BP Readings from Last 3 Encounters:  03/20/22 120/70  11/13/21 108/70  02/07/21 92/70   Pulse Readings from Last 3 Encounters:  03/20/22 65  11/13/21 88  02/07/21 63    Physical Exam Vitals and nursing note reviewed.  Constitutional:      Appearance: Normal appearance. She is well-developed and well-groomed.  HENT:     Head: Normocephalic and atraumatic.  Eyes:  Conjunctiva/sclera: Conjunctivae normal.     Pupils: Pupils are equal, round, and reactive to light.  Cardiovascular:     Rate and Rhythm: Normal rate and regular rhythm.     Heart sounds: Normal heart sounds. No murmur heard. Pulmonary:     Effort: Pulmonary effort is normal.     Breath sounds: Normal breath sounds.  Abdominal:     General: Abdomen is flat. Bowel sounds are normal.     Tenderness: There is no abdominal tenderness.  Musculoskeletal:        General: No tenderness.  Skin:    General: Skin is warm and dry.  Neurological:     General: No focal deficit present.     Mental Status: She is alert and oriented to person, place, and time. Mental status is at baseline.     Cranial Nerves: Cranial nerves 2-12 are intact.     Motor: Motor function is  intact.     Coordination: Coordination is intact.     Gait: Gait is intact.  Psychiatric:        Attention and Perception: Attention and perception normal.        Mood and Affect: Mood and affect normal.        Speech: Speech normal.        Behavior: Behavior normal. Behavior is cooperative.        Thought Content: Thought content normal.        Cognition and Memory: Cognition and memory normal.        Judgment: Judgment normal.     Assessment  Plan  Annual physical exam - Plan: Comprehensive metabolic panel, Lipid panel, CBC with Differential/Platelet, TSH, T4, free See below   Fatigue, unspecified type - Plan: Comprehensive metabolic panel, Lipid panel, CBC with Differential/Platelet  Screening for blood or protein in urine - Plan: Urinalysis, Routine w reflex microscopic  Iron deficiency - Plan: IBC + Ferritin  Vitamin D deficiency - Plan: Vitamin D (25 hydroxy)  B12 deficiency - Plan: Vitamin B12  Thyroid disorder screening - Plan: TSH, T4, free  Lipid screening - Plan: Lipid panel  Had flu shot utd  Tdap utd Hep B immune  covid 19 vaccine, covid shot 01/22/22 pt thinks total 4/4   Continue exercise and healthy diet choices   Pap 02/07/21 utd f/u ob/gyn dr. Linda Hedges [facility] 04/2022 low sex drive     6/96/78 mammogram [facility] done 02/04/18 negative  -02/28/2019 negative  06/26/21 neg mammogram screening b/l with right [place] repeat Will try to get with ob/gyn in 06/2022    Colonoscopy age 15 referred had 07/2021 [facility]   -- dermatology  -[address] Dr. Kellie Moor cysts to scalp may call for removal soon in 2023 as of 03/20/22   Of note no cycles in 1 year but restarted bleeding  Bradycardia  Pt w/o sx's likely due to strenuous cardio   [place] f/u 10/2020 proteinuria f/u in 2023 yearly     Provider: Dr. Olivia Mackie McLean-Scocuzza-Internal Medicine

## 2022-03-20 NOTE — Patient Instructions (Addendum)
Tranquil sleep stress relax brand amazon or whole foods  1-2 at bedtime as needed    Dr. Volanda Napoleon new PCP  Menopause Menopause is the normal time of a woman's life when menstrual periods stop completely. It marks the natural end to a woman's ability to become pregnant. It can be defined as the absence of a menstrual period for 12 months without another medical cause. The transition to menopause (perimenopause) most often happens between the ages of 60 and 99, and can last for many years. During perimenopause, hormone levels change in your body, which can cause symptoms and affect your health. Menopause may increase your risk for: Weakened bones (osteoporosis), which causes fractures. Depression. Hardening and narrowing of the arteries (atherosclerosis), which can cause heart attacks and strokes. What are the causes? This condition is usually caused by a natural change in hormone levels that happens as you get older. The condition may also be caused by changes that are not natural, including: Surgery to remove both ovaries (surgical menopause). Side effects from some medicines, such as chemotherapy used to treat cancer (chemical menopause). What increases the risk? This condition is more likely to start at an earlier age if you have certain medical conditions or have undergone treatments, including: A tumor of the pituitary gland in the brain. A disease that affects the ovaries and hormones. Certain cancer treatments, such as chemotherapy or hormone therapy, or radiation therapy on the pelvis. Heavy smoking and excessive alcohol use. Family history of early menopause. This condition is also more likely to develop earlier in women who are very thin. What are the signs or symptoms? Symptoms of this condition include: Hot flashes. Irregular menstrual periods. Night sweats. Changes in feelings about sex. This could be a decrease in sex drive or an increased discomfort around your  sexuality. Vaginal dryness and thinning of the vaginal walls. This may cause painful sex. Dryness of the skin and development of wrinkles. Headaches. Problems sleeping (insomnia). Mood swings or irritability. Memory problems. Weight gain. Hair growth on the face and chest. Bladder infections or problems with urinating. How is this diagnosed? This condition is diagnosed based on your medical history, a physical exam, your age, your menstrual history, and your symptoms. Hormone tests may also be done. How is this treated? In some cases, no treatment is needed. You and your health care provider should make a decision together about whether treatment is necessary. Treatment will be based on your individual condition and preferences. Treatment for this condition focuses on managing symptoms. Treatment may include: Menopausal hormone therapy (MHT). Medicines to treat specific symptoms or complications. Acupuncture. Vitamin or herbal supplements. Before starting treatment, make sure to let your health care provider know if you have a personal or family history of these conditions: Heart disease. Breast cancer. Blood clots. Diabetes. Osteoporosis. Follow these instructions at home: Lifestyle Do not use any products that contain nicotine or tobacco, such as cigarettes, e-cigarettes, and chewing tobacco. If you need help quitting, ask your health care provider. Get at least 30 minutes of physical activity on 5 or more days each week. Avoid alcoholic and caffeinated beverages, as well as spicy foods. This may help prevent hot flashes. Get 7-8 hours of sleep each night. If you have hot flashes, try: Dressing in layers. Avoiding things that may trigger hot flashes, such as spicy food, warm places, or stress. Taking slow, deep breaths when a hot flash starts. Keeping a fan in your home and office. Find ways to manage stress, such  as deep breathing, meditation, or journaling. Consider going to  group therapy with other women who are having menopause symptoms. Ask your health care provider about recommended group therapy meetings. Eating and drinking  Eat a healthy, balanced diet that contains whole grains, lean protein, low-fat dairy, and plenty of fruits and vegetables. Your health care provider may recommend adding more soy to your diet. Foods that contain soy include tofu, tempeh, and soy milk. Eat plenty of foods that contain calcium and vitamin D for bone health. Items that are rich in calcium include low-fat milk, yogurt, beans, almonds, sardines, broccoli, and kale. Medicines Take over-the-counter and prescription medicines only as told by your health care provider. Talk with your health care provider before starting any herbal supplements. If prescribed, take vitamins and supplements as told by your health care provider. General instructions  Keep track of your menstrual periods, including: When they occur. How heavy they are and how long they last. How much time passes between periods. Keep track of your symptoms, noting when they start, how often you have them, and how long they last. Use vaginal lubricants or moisturizers to help with vaginal dryness and improve comfort during sex. Keep all follow-up visits. This is important. This includes any group therapy or counseling. Contact a health care provider if: You are still having menstrual periods after age 81. You have pain during sex. You have not had a period for 12 months and you develop vaginal bleeding. Get help right away if you have: Severe depression. Excessive vaginal bleeding. Pain when you urinate. A fast or irregular heartbeat (palpitations). Severe headaches. Abdominal pain or severe indigestion. Summary Menopause is a normal time of life when menstrual periods stop completely. It is usually defined as the absence of a menstrual period for 12 months without another medical cause. The transition to  menopause (perimenopause) most often happens between the ages of 25 and 62 and can last for several years. Symptoms can be managed through medicines, lifestyle changes, and complementary therapies such as acupuncture. Eat a balanced diet that is rich in nutrients to promote bone health and heart health and to manage symptoms during menopause. This information is not intended to replace advice given to you by your health care provider. Make sure you discuss any questions you have with your health care provider. Document Revised: 05/11/2020 Document Reviewed: 01/26/2020 Elsevier Patient Education  [place].

## 2022-05-08 DIAGNOSIS — N870 Mild cervical dysplasia: Secondary | ICD-10-CM | POA: Insufficient documentation

## 2022-06-18 ENCOUNTER — Telehealth: Payer: Self-pay

## 2022-06-18 NOTE — Telephone Encounter (Signed)
Patient states Dr. Lucillie Garfinkel has prescribed a combi patch for patient and patient would like to have our take on starting this.  Patient would like to know if she starts it, how easily can she discontinue taking it?  Patient states she would like to know if we would recommend an over the counter option instead.

## 2022-06-19 NOTE — Telephone Encounter (Signed)
I called and LVM for the patient to call back or check her mychart for comments from the provider about this message.  Haiden Clucas,cma

## 2022-06-23 NOTE — Telephone Encounter (Signed)
LVM for the patient to call back. Kotaro Buer,cma  

## 2022-06-24 ENCOUNTER — Encounter: Payer: 59 | Admitting: Family Medicine

## 2022-06-27 NOTE — Telephone Encounter (Signed)
I called and spoke with the patient and informed her that Dr. Volanda Napoleon stated she did not have to wean off the medicaiotn but to talk to her OB/GYN about this and she underdtood.  Demmi Sindt,cma

## 2022-07-01 LAB — HM MAMMOGRAPHY
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
## 2022-07-03 ENCOUNTER — Ambulatory Visit: Payer: 59 | Admitting: Family Medicine

## 2022-07-03 ENCOUNTER — Ambulatory Visit (INDEPENDENT_AMBULATORY_CARE_PROVIDER_SITE_OTHER): Payer: 59

## 2022-07-03 ENCOUNTER — Ambulatory Visit: Payer: Self-pay

## 2022-07-03 VITALS — BP 122/74 | HR 81 | Ht 64.0 in | Wt 151.0 lb

## 2022-07-03 DIAGNOSIS — G8929 Other chronic pain: Secondary | ICD-10-CM | POA: Diagnosis not present

## 2022-07-03 DIAGNOSIS — M25562 Pain in left knee: Secondary | ICD-10-CM

## 2022-07-03 NOTE — Patient Instructions (Signed)
Thank you for coming in today.   Please use Voltaren gel (Generic Diclofenac Gel) up to 4x daily for pain as needed.  This is available over-the-counter as both the name brand Voltaren gel and the generic diclofenac gel.   I've referred you to Physical Therapy.  Let us know if you don't hear from them in one week.   Please get an Xray today before you leave   Lets give PT a 6 week trial unless you get worse or have problems or you need to get the MRI sooner.

## 2022-07-07 NOTE — Progress Notes (Signed)
Left knee x-ray shows some swelling in the knee joint.  No fractures or significant or severe arthritis is visible.
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
## 2022-08-15 ENCOUNTER — Ambulatory Visit: Payer: Self-pay

## 2022-08-15 ENCOUNTER — Ambulatory Visit: Payer: 59 | Admitting: Family Medicine

## 2022-08-15 VITALS — BP 116/74 | HR 64 | Ht 64.0 in | Wt 153.0 lb

## 2022-08-15 DIAGNOSIS — M25562 Pain in left knee: Secondary | ICD-10-CM

## 2022-08-15 DIAGNOSIS — G8929 Other chronic pain: Secondary | ICD-10-CM

## 2022-08-15 NOTE — Patient Instructions (Signed)
Thank you for coming in today.   I think this is ok to give it some time.   Lets give it another 2 month.   Recheck in 2 months.

## 2022-10-14 NOTE — Progress Notes (Unsigned)
   I, Erika [name], LAT, ATC acting as a scribe for Erika Leader, MD.  Erika [name] is a 47 y.o. female who presents to [place] at [facility] today for 63-monthfollow-up left knee pain thought to be due to pes anserine bursitis.  Patient was last seen by Dr. CGeorgina [name] 08/15/2022 and was advised to use Voltaren gel and taught HEP.  Today, patient reports  Dx imaging: 07/03/22 L knee XR   Pertinent review of systems: ***  Relevant historical information: ***   Exam:  There were no vitals taken for this visit. General: Well Developed, well nourished, and in no acute distress.   MSK: ***    Lab and Radiology Results No results found for this or any previous visit (from the past 72 hour(s)). No results found.     Assessment and Plan: 47y.o. female with ***   PDMP not reviewed this encounter. No orders of the defined types were placed in this encounter.  No orders of the defined types were placed in this encounter.    Discussed warning signs or symptoms. Please see discharge instructions. Patient expresses understanding.   ***

## 2022-10-14 NOTE — Patient Instructions (Incomplete)
It was a pleasure meeting you today. Thank you for allowing me to take part in your health care.  Our goals for today as we discussed include:  Start triamcinolone cream twice daily as needed to affected area.  Apply small amount.  We will get some labs today.  If they are abnormal or we need to do something about them, I will call you.  If they are normal, I will send you a message on MyChart (if it is active) or a letter in the mail.  If you don't hear from [place] in 2 weeks, please call the office at the number below.   Recommend vitamin D8 100 IU daily Recommend calcium 1200 mg daily   If you have any questions or concerns, please do not hesitate to call the office at [phone].  I look forward to our next visit and until then take care and stay safe.  Regards,   Dana Allan, MD   [facility]

## 2022-10-14 NOTE — Progress Notes (Signed)
SUBJECTIVE:   Chief Complaint  Patient presents with   Transitions Of Care    Lump under arm/rash on foot   HPI Patient presents to clinic to transfer care.  No acute concerns today.  Patient reports rash on right foot.  Rash located between third and fourth toes.  Intermittent itching.  Symptoms started over a year ago.  Has not spread to other areas.  Sometimes becomes inflamed and reddened.  Has tried over-the-counter athletes foot remedies without resolution.  Right axillary lump. Chronic issue.  Had mammogram in ultrasound completed and was told benign lesion.  Wondering what next steps for management.  Denies any fevers, pain, increasing growth, swelling or erythema.  No decrease in range of motion.  Works out at [employer] regularly.  Denies any injury or strain.   Menopausal LMP 2 years ago.  Recently started on estradiol transdermal patch 0.025 mg daily and progesterone 100 mg daily.  Follows with [employer] and Dr. Royston Sinner at OB/GYN.  Proteinuria. Reports history of protein urea now followed by nephrology, Dr. Royce Macadamia.  Proteinuria subnephrotic and not consistent with GN.  Possible proteinuria secondary to exercise.  Avoid NSAIDs.  Increase fluids.   PERTINENT PMH / PSH: Menopausal age 47 recently started on HRT Proteinuria, intermittent Nephrolithiasis   OBJECTIVE:  BP 100/60   Pulse 70   Temp 97.6 F (36.4 C) (Oral)   Ht '5\' 4"'$  (1.626 m)   Wt 154 lb 3.2 oz (69.9 kg)   SpO2 99%   BMI 26.47 kg/m    Physical Exam Vitals reviewed.  Constitutional:      General: She is not in acute distress.    Appearance: She is not ill-appearing.  HENT:     Head: Normocephalic.     Nose: Nose normal.  Eyes:     Conjunctiva/sclera: Conjunctivae normal.  Cardiovascular:     Rate and Rhythm: Normal rate and regular rhythm.     Heart sounds: Normal heart sounds.  Pulmonary:     Effort: Pulmonary effort is normal.     Breath sounds: Normal breath sounds.  Chest:      Comments: Mobile, nontender, soft cystic-like lesion noted right posterior axillary area. Abdominal:     General: Abdomen is flat. Bowel sounds are normal.     Palpations: Abdomen is soft.  Musculoskeletal:        General: Normal range of motion.     Cervical back: Normal range of motion.  Lymphadenopathy:     Upper Body:     Right upper body: No axillary adenopathy.     Left upper body: No axillary adenopathy.  Neurological:     Mental Status: She is alert and oriented to person, place, and time. Mental status is at baseline.  Psychiatric:        Mood and Affect: Mood normal.        Behavior: Behavior normal.        Thought Content: Thought content normal.        Judgment: Judgment normal.     ASSESSMENT/PLAN:  Axillary mass, right Assessment & Plan: Suspect lipoma. Will review chart and discuss repeating soft tissue ultrasound in next visit. Consider GEN surgery referral in future for removal if patient wishes to proceed.   Dyshidrotic eczema Assessment & Plan: Small rough patch filled between third and fourth toe.  Given increased moisture likely dyshidrotic eczema. Will trial triamcinolone ointment twice daily as needed Follow-up if no improvement in symptoms  Orders: -  Triamcinolone Acetonide; Apply 1 Application topically 2 (two) times daily.  Dispense: 60 g; Refill: 0  Vitamin D deficiency Assessment & Plan: Check vitamin D levels  Orders: -     VITAMIN D 25 Hydroxy (Vit-D Deficiency, Fractures)  Lipid screening -     Lipid panel -     Comprehensive metabolic panel  Iron deficiency Assessment & Plan: Chronic.  Asymptomatic. Check CBC   Orders: -     CBC with Differential/Platelet  B12 deficiency Assessment & Plan: Check B12 levels today  Orders: -     Vitamin B12  Colon cancer screening -     Ambulatory referral to Gastroenterology  Hyperlipidemia, unspecified hyperlipidemia type Assessment & Plan: Repeat  lipids   Microalbuminuria Assessment & Plan: Follows with nephrology. Request nephrology notes   Proteinuria, unspecified type Assessment & Plan: Request records from [place] [facility]    Menopause Assessment & Plan: LMP at age 47.  Continues to have vasomotor symptoms Recently started estradiol transdermal 0.025 mg daily and progesterone micro 100 mg daily. Currently managed by [facility] with OB/GYN for Pap.  Due 2025 Recommend calcium 1200 mg daily Recommend vitamin D 800 units daily Consider DEXA scan at 15    PDMP reviewed  Return if symptoms worsen or fail to improve.  Carollee Leitz, MD

## 2022-10-15 ENCOUNTER — Ambulatory Visit: Payer: 59 | Admitting: Family Medicine

## 2022-10-15 ENCOUNTER — Encounter: Payer: Self-pay | Admitting: Family Medicine

## 2022-10-15 ENCOUNTER — Ambulatory Visit: Payer: Self-pay

## 2022-10-15 VITALS — BP 100/60 | HR 70 | Temp 97.6°F | Ht 64.0 in | Wt 154.2 lb

## 2022-10-15 VITALS — BP 122/80 | HR 59 | Ht 64.0 in | Wt 153.2 lb

## 2022-10-15 DIAGNOSIS — R809 Proteinuria, unspecified: Secondary | ICD-10-CM

## 2022-10-15 DIAGNOSIS — G8929 Other chronic pain: Secondary | ICD-10-CM

## 2022-10-15 DIAGNOSIS — R2231 Localized swelling, mass and lump, right upper limb: Secondary | ICD-10-CM

## 2022-10-15 DIAGNOSIS — Z1389 Encounter for screening for other disorder: Secondary | ICD-10-CM

## 2022-10-15 DIAGNOSIS — M546 Pain in thoracic spine: Secondary | ICD-10-CM | POA: Diagnosis not present

## 2022-10-15 DIAGNOSIS — L301 Dyshidrosis [pompholyx]: Secondary | ICD-10-CM

## 2022-10-15 DIAGNOSIS — E559 Vitamin D deficiency, unspecified: Secondary | ICD-10-CM | POA: Diagnosis not present

## 2022-10-15 DIAGNOSIS — E611 Iron deficiency: Secondary | ICD-10-CM

## 2022-10-15 DIAGNOSIS — Z1329 Encounter for screening for other suspected endocrine disorder: Secondary | ICD-10-CM

## 2022-10-15 DIAGNOSIS — E785 Hyperlipidemia, unspecified: Secondary | ICD-10-CM

## 2022-10-15 DIAGNOSIS — E538 Deficiency of other specified B group vitamins: Secondary | ICD-10-CM | POA: Diagnosis not present

## 2022-10-15 DIAGNOSIS — Z13220 Encounter for screening for lipoid disorders: Secondary | ICD-10-CM | POA: Diagnosis not present

## 2022-10-15 DIAGNOSIS — Z1211 Encounter for screening for malignant neoplasm of colon: Secondary | ICD-10-CM

## 2022-10-15 DIAGNOSIS — M25562 Pain in left knee: Secondary | ICD-10-CM | POA: Diagnosis not present

## 2022-10-15 DIAGNOSIS — Z780 Asymptomatic menopausal state: Secondary | ICD-10-CM

## 2022-10-15 LAB — CBC WITH DIFFERENTIAL/PLATELET
Basophils Absolute: 0.1 10*3/uL (ref 0.0–0.1)
Basophils Relative: 0.8 % (ref 0.0–3.0)
Eosinophils Absolute: 0.1 10*3/uL (ref 0.0–0.7)
Eosinophils Relative: 1.2 % (ref 0.0–5.0)
HCT: 38.8 % (ref 36.0–46.0)
Hemoglobin: 12.9 g/dL (ref 12.0–15.0)
Lymphocytes Relative: 31.6 % (ref 12.0–46.0)
Lymphs Abs: 2.3 10*3/uL (ref 0.7–4.0)
MCHC: 33.4 g/dL (ref 30.0–36.0)
MCV: 89.6 fl (ref 78.0–100.0)
Monocytes Absolute: 0.6 10*3/uL (ref 0.1–1.0)
Monocytes Relative: 8.8 % (ref 3.0–12.0)
Neutro Abs: 4.1 10*3/uL (ref 1.4–7.7)
Neutrophils Relative %: 57.6 % (ref 43.0–77.0)
Platelets: 328 10*3/uL (ref 150.0–400.0)
RBC: 4.33 Mil/uL (ref 3.87–5.11)
RDW: 14.1 % (ref 11.5–15.5)
WBC: 7.1 10*3/uL (ref 4.0–10.5)

## 2022-10-15 LAB — VITAMIN D 25 HYDROXY (VIT D DEFICIENCY, FRACTURES): VITD: 22.32 ng/mL — ABNORMAL LOW (ref 30.00–100.00)

## 2022-10-15 LAB — VITAMIN B12: Vitamin B-12: 330 pg/mL (ref 211–911)

## 2022-10-15 MED ORDER — TRIAMCINOLONE ACETONIDE 0.1 % EX CREA: 1.0000 | TOPICAL_CREAM | Freq: Two times a day (BID) | CUTANEOUS | 0 refills | Status: DC

## 2022-10-15 NOTE — Patient Instructions (Signed)
Thank you for coming in today.   I agree with dry needling.  Consider PT if that does not work well.   Keep working on the knee.   More tests to do in the future.   I am trying to order a axilla ultraound to [facility].  There is a good chance I screwed it up.  Let me know if they dont call you.

## 2022-10-16 ENCOUNTER — Other Ambulatory Visit: Payer: Self-pay | Admitting: Family Medicine

## 2022-10-16 DIAGNOSIS — E559 Vitamin D deficiency, unspecified: Secondary | ICD-10-CM

## 2022-10-16 LAB — COMPREHENSIVE METABOLIC PANEL
ALT: 14 U/L (ref 0–35)
AST: 21 U/L (ref 0–37)
Albumin: 4.4 g/dL (ref 3.5–5.2)
Alkaline Phosphatase: 81 U/L (ref 39–117)
BUN: 23 mg/dL (ref 6–23)
CO2: 25 mEq/L (ref 19–32)
Calcium: 9.6 mg/dL (ref 8.4–10.5)
Chloride: 103 mEq/L (ref 96–112)
Creatinine, Ser: 1.04 mg/dL (ref 0.40–1.20)
GFR: 64.09 mL/min (ref >60.00)
Glucose, Bld: 80 mg/dL (ref 70–99)
Potassium: 4.4 mEq/L (ref 3.5–5.1)
Sodium: 139 mEq/L (ref 135–145)
Total Bilirubin: 0.7 mg/dL (ref 0.2–1.2)
Total Protein: 7.1 g/dL (ref 6.0–8.3)

## 2022-10-16 LAB — LIPID PANEL
Cholesterol: 195 mg/dL (ref 0–200)
HDL: 57.7 mg/dL (ref >39.00)
LDL Cholesterol: 111 mg/dL — ABNORMAL HIGH (ref 0–99)
NonHDL: 137.79
Total CHOL/HDL Ratio: 3
Triglycerides: 135 mg/dL (ref 0.0–149.0)
VLDL: 27 mg/dL (ref 0.0–40.0)

## 2022-10-16 MED ORDER — VITAMIN D (ERGOCALCIFEROL) 1.25 MG (50000 UNIT) PO CAPS: 50000.0000 [IU] | ORAL_CAPSULE | ORAL | 1 refills | Status: DC

## 2022-10-18 ENCOUNTER — Encounter: Payer: Self-pay | Admitting: Family Medicine

## 2022-10-18 DIAGNOSIS — E611 Iron deficiency: Secondary | ICD-10-CM | POA: Insufficient documentation

## 2022-10-18 DIAGNOSIS — Z13220 Encounter for screening for lipoid disorders: Secondary | ICD-10-CM | POA: Insufficient documentation

## 2022-10-18 DIAGNOSIS — L301 Dyshidrosis [pompholyx]: Secondary | ICD-10-CM | POA: Insufficient documentation

## 2022-10-18 DIAGNOSIS — Z780 Asymptomatic menopausal state: Secondary | ICD-10-CM | POA: Insufficient documentation

## 2022-10-18 DIAGNOSIS — E538 Deficiency of other specified B group vitamins: Secondary | ICD-10-CM | POA: Insufficient documentation

## 2022-10-18 DIAGNOSIS — Z1211 Encounter for screening for malignant neoplasm of colon: Secondary | ICD-10-CM | POA: Insufficient documentation

## 2022-10-18 HISTORY — DX: Encounter for screening for lipoid disorders: Z13.220

## 2022-10-18 NOTE — Assessment & Plan Note (Signed)
Request records from [facility]

## 2022-10-18 NOTE — Assessment & Plan Note (Signed)
Check B12 levels today

## 2022-10-18 NOTE — Assessment & Plan Note (Signed)
Chronic.  Asymptomatic. Check CBC

## 2022-10-18 NOTE — Assessment & Plan Note (Signed)
Repeat lipids

## 2022-10-18 NOTE — Assessment & Plan Note (Signed)
Suspect lipoma. Will review chart and discuss repeating soft tissue ultrasound in next visit. Consider GEN surgery referral in future for removal if patient wishes to proceed.

## 2022-10-18 NOTE — Assessment & Plan Note (Signed)
Follows with nephrology. Request nephrology notes

## 2022-10-18 NOTE — Assessment & Plan Note (Signed)
Small rough patch filled between third and fourth toe.  Given increased moisture likely dyshidrotic eczema. Will trial triamcinolone ointment twice daily as needed Follow-up if no improvement in symptoms

## 2022-10-18 NOTE — Assessment & Plan Note (Deleted)
LMP at age 47. Currently on estradiol transdermal 0.025 mg daily and progesterone micro 100 mg daily. Currently managed by [place] with OB/GYN for Pap

## 2022-10-18 NOTE — Assessment & Plan Note (Signed)
Check vitamin D levels

## 2022-10-18 NOTE — Assessment & Plan Note (Addendum)
LMP at age 47.  Continues to have vasomotor symptoms Recently started estradiol transdermal 0.025 mg daily and progesterone micro 100 mg daily. Currently managed by [facility] with OB/GYN for Pap.  Due 2025 Recommend calcium 1200 mg daily Recommend vitamin D 800 units daily Consider DEXA scan at 42

## 2022-10-23 ENCOUNTER — Encounter: Payer: Self-pay | Admitting: Family Medicine

## 2022-10-29 IMAGING — US US RENAL
1 series · 14 of 25 positions shown · non-contrast
Comparison: None available.

CLINICAL DATA: Initial evaluation for proteinuria, positive VS.

EXAM:
RENAL / URINARY TRACT ULTRASOUND COMPLETE

[Series 1: us renal · 0.19mm/px · 14 of 84 slices shown]
[im 1/84]
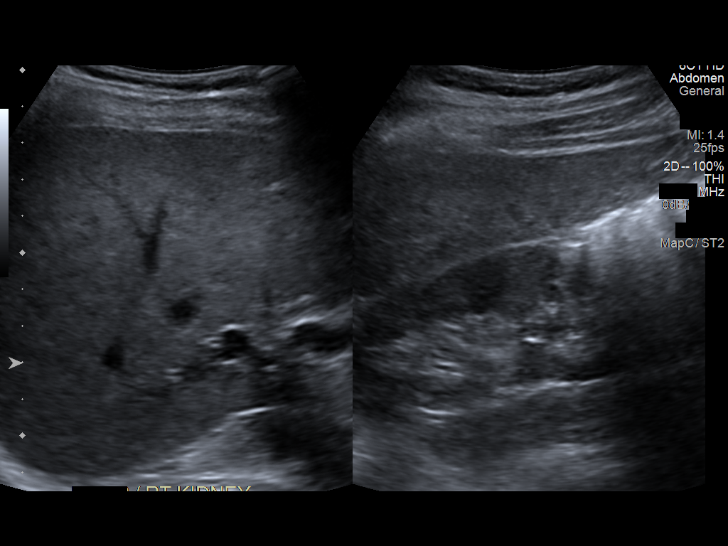
[im 7/84]
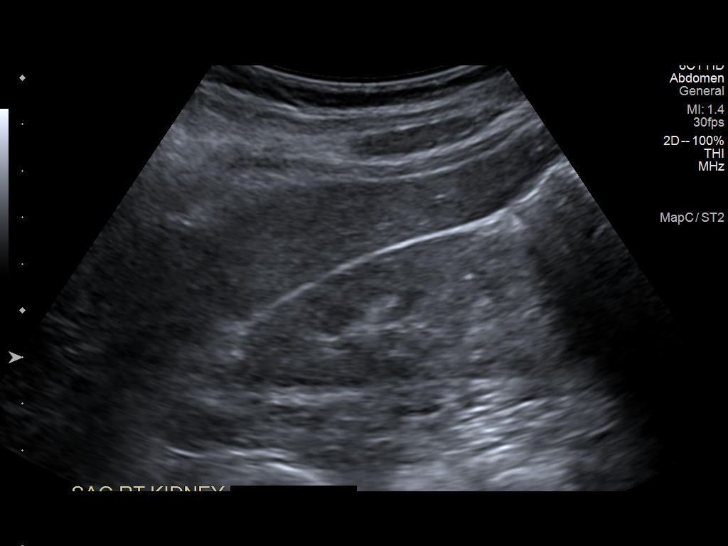
[im 14/84]
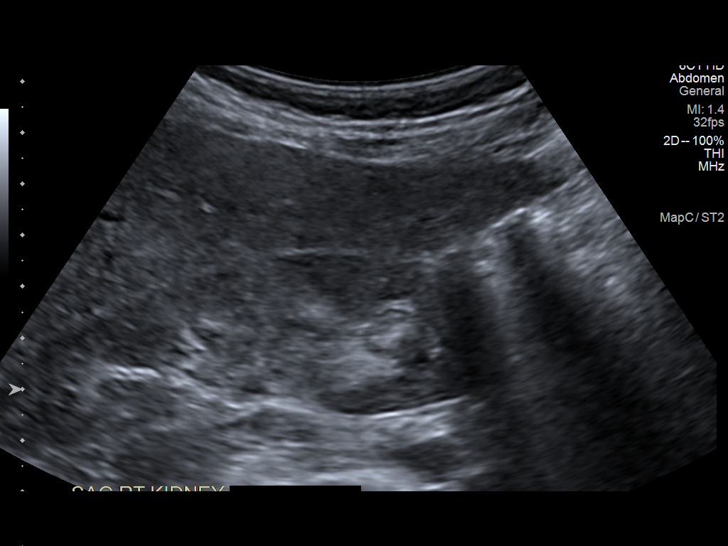
[im 21/84]
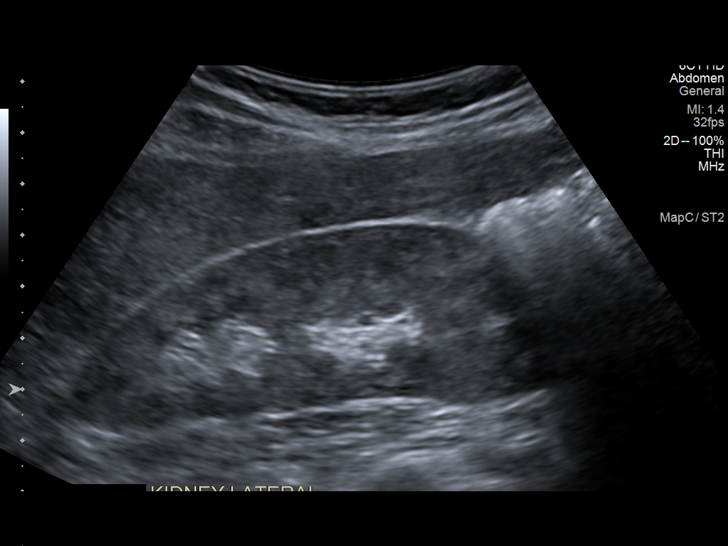
[im 28/84]
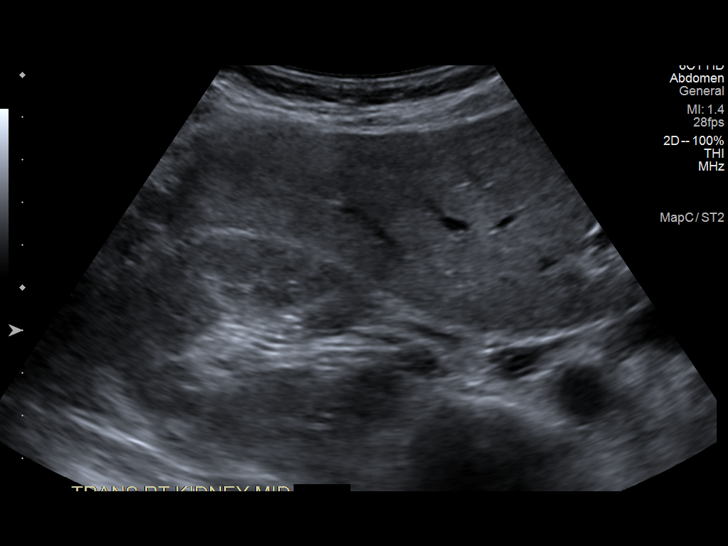
[im 32/84]
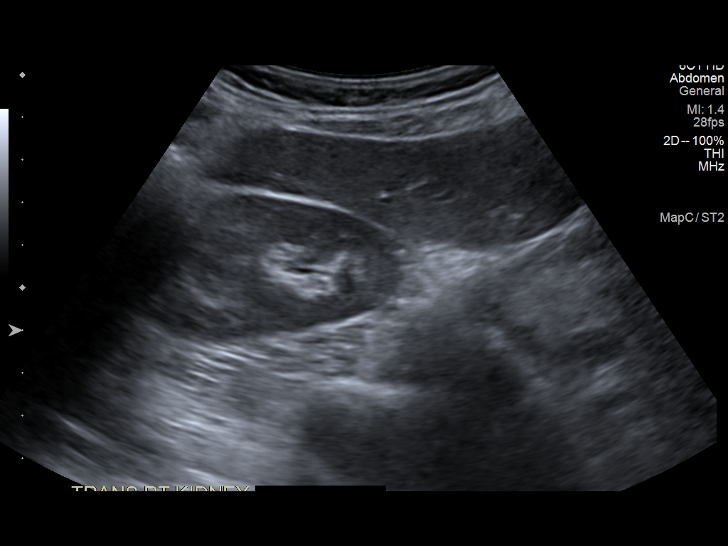
[im 39/84]
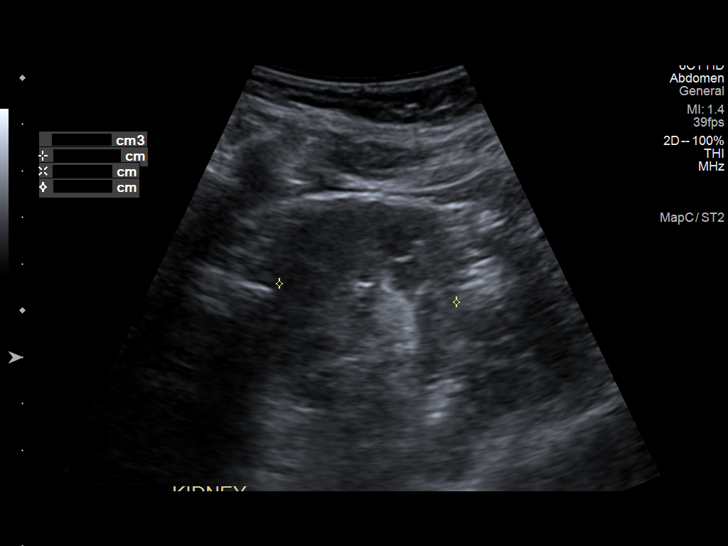
[im 45/84]
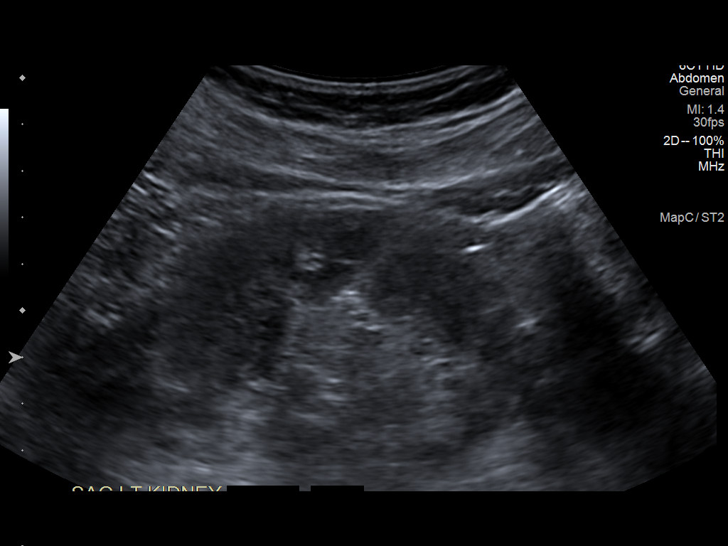
[im 52/84]
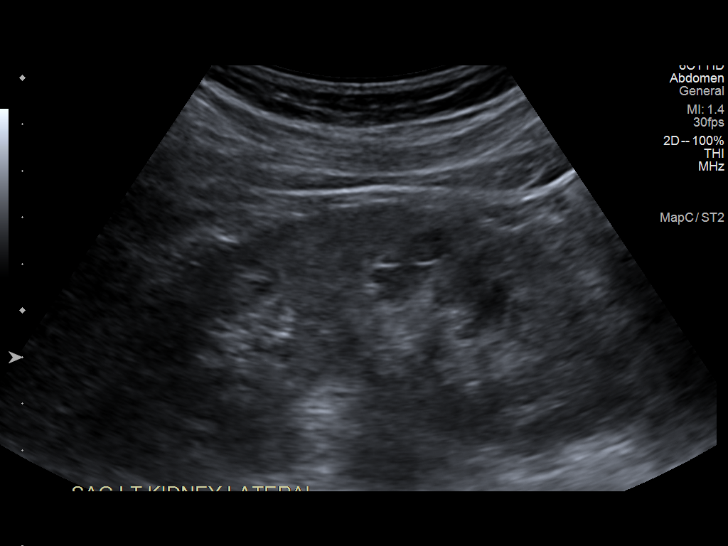
[im 56/84]
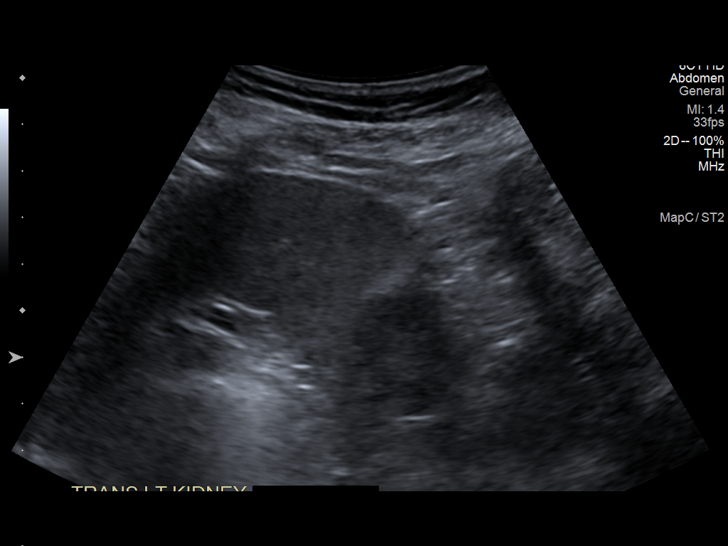
[im 63/84]
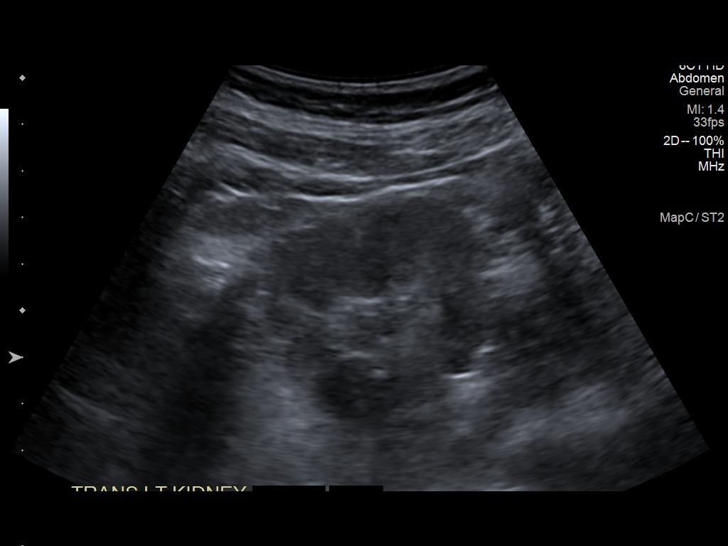
[im 70/84]
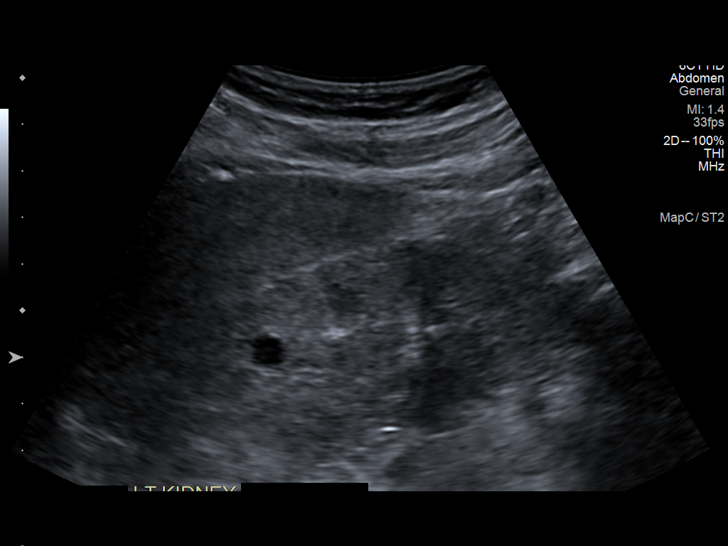
[im 77/84]
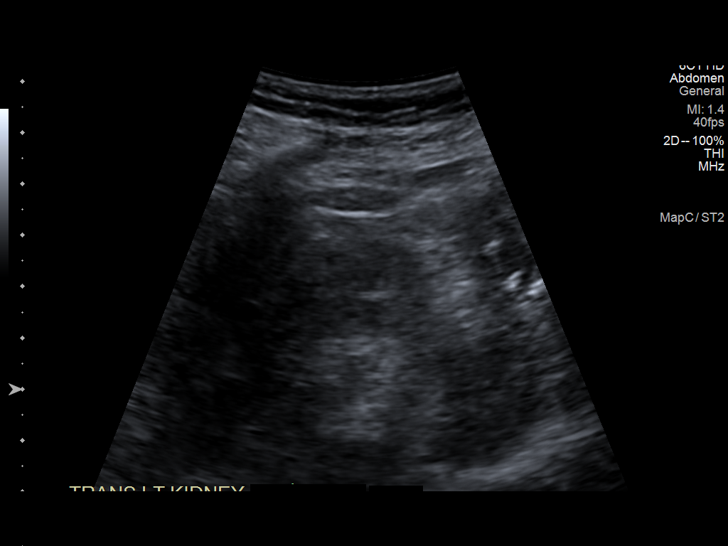
[im 84/84]
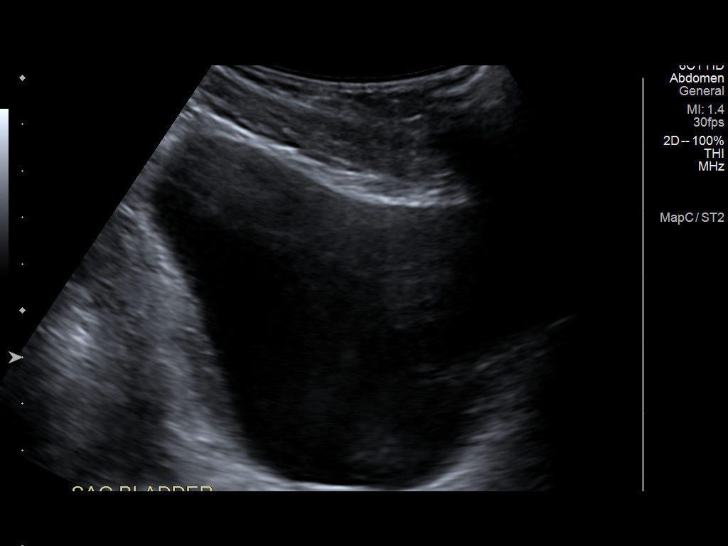

[14 of 25 positions shown; findings below may reference images not displayed]

FINDINGS: Right Kidney:

Renal measurements: 10.0 x 3.5 x 3.9 cm = volume: 70.7 mL. Increased
echogenicity seen within the renal parenchyma. No nephrolithiasis or
hydronephrosis. 2.2 cm simple exophytic cyst arises from the upper
pole.

Left Kidney:

Renal measurements: 11.1 x 6.1 x 3.8 cm = volume: 135.4 mL.
Increased echogenicity seen within the renal parenchyma. At least 2
nonobstructive calculi measuring up to 3 mm present at the mid-lower
pole. No hydronephrosis. 7 mm simple cyst present at the upper pole.
An additional 7 mm simple cyst present at the lower pole.

Bladder:

Appears normal for degree of bladder distention.

Other:

None.
IMPRESSION: 1. Increased echogenicity within the renal parenchyma, consistent
with medical renal disease.
2. Nonobstructive left renal calculi measuring up to 3 mm. No
hydronephrosis.
3. Simple bilateral renal cysts as above, largest of which measures
2.2 cm at the upper pole the right kidney. These are benign in
appearance, with no follow-up imaging recommended.

## 2022-11-18 ENCOUNTER — Telehealth: Payer: Self-pay

## 2022-11-18 DIAGNOSIS — R2231 Localized swelling, mass and lump, right upper limb: Secondary | ICD-10-CM

## 2022-11-18 NOTE — Telephone Encounter (Signed)
[facility] called in regard to the [place] order that was sent to them via fax. In order to recheck they also need an order for a right diagnostic mamogram to go with the [place] order. I am placing that order and faxing it over to them.

## 2023-03-25 ENCOUNTER — Encounter: Payer: Self-pay | Admitting: Family Medicine

## 2023-03-25 ENCOUNTER — Ambulatory Visit (INDEPENDENT_AMBULATORY_CARE_PROVIDER_SITE_OTHER): Payer: 59 | Admitting: Family Medicine

## 2023-03-25 ENCOUNTER — Encounter: Payer: 59 | Admitting: Family Medicine

## 2023-03-25 VITALS — BP 104/76 | HR 54 | Temp 97.7°F | Resp 16 | Ht 64.0 in | Wt 151.5 lb

## 2023-03-25 DIAGNOSIS — Z0000 Encounter for general adult medical examination without abnormal findings: Secondary | ICD-10-CM | POA: Diagnosis not present

## 2023-03-25 DIAGNOSIS — E559 Vitamin D deficiency, unspecified: Secondary | ICD-10-CM

## 2023-03-25 MED ORDER — VITAMIN D (ERGOCALCIFEROL) 1.25 MG (50000 UNIT) PO CAPS: 50000.0000 [IU] | ORAL_CAPSULE | ORAL | 1 refills | Status: DC

## 2023-03-25 NOTE — Progress Notes (Signed)
SUBJECTIVE:   Chief Complaint  Patient presents with   Annual Exam   HPI Patient presents to clinic for annual physical  No acute concerns today.  Menopausal LMP 2 years ago.   Estradiol transdermal patch dosage increased 0.375 mg daily and progesterone 100 mg daily.  Follows with [employer] and Dr. Elon Spanner at OB/GYN.  Vitamin D deficiency Requesting refill for supplements.  Denies SI/HI Denies recent falls  Active, works out regularly Healthy nutrition   PERTINENT PMH / PSH: Menopausal age 37 recently started on HRT Proteinuria, intermittent Nephrolithiasis   OBJECTIVE:  BP 104/76   Pulse (!) 54   Temp 97.7 F (36.5 C)   Resp 16   Ht 5\' 4"  (1.626 m)   Wt 151 lb 8 oz (68.7 kg)   SpO2 98%   BMI 26.00 kg/m    Physical Exam Vitals reviewed.  Constitutional:      General: She is not in acute distress.    Appearance: She is not ill-appearing.  HENT:     Head: Normocephalic.     Right Ear: Tympanic membrane, ear canal and external ear normal.     Left Ear: Tympanic membrane, ear canal and external ear normal.     Nose: Nose normal.     Mouth/Throat:     Mouth: Mucous membranes are moist.  Eyes:     Extraocular Movements: Extraocular movements intact.     Conjunctiva/sclera: Conjunctivae normal.     Pupils: Pupils are equal, round, and reactive to light.  Neck:     Thyroid: No thyromegaly or thyroid tenderness.     Vascular: No carotid bruit.  Cardiovascular:     Rate and Rhythm: Normal rate and regular rhythm.     Pulses: Normal pulses.     Heart sounds: Normal heart sounds.  Pulmonary:     Effort: Pulmonary effort is normal.     Breath sounds: Normal breath sounds.  Abdominal:     General: Bowel sounds are normal. There is no distension.     Palpations: Abdomen is soft.     Tenderness: There is no abdominal tenderness. There is no right CVA tenderness, left CVA tenderness, guarding or rebound.  Musculoskeletal:        General: Normal range of  motion.     Cervical back: Normal range of motion.     Right lower leg: No edema.     Left lower leg: No edema.  Skin:    Capillary Refill: Capillary refill takes less than 2 seconds.  Neurological:     General: No focal deficit present.     Mental Status: She is alert and oriented to person, place, and time. Mental status is at baseline.     Motor: No weakness.  Psychiatric:        Mood and Affect: Mood normal.        Behavior: Behavior normal.        Thought Content: Thought content normal.        Judgment: Judgment normal.       03/25/2023    9:29 AM 10/15/2022    9:20 AM 03/20/2022    9:35 AM 02/07/2021    9:31 AM 02/08/2020    9:04 AM  Depression screen PHQ 2/9  Decreased Interest 0 0 0 0 0  Down, Depressed, Hopeless 0 0 0 0 0  PHQ - 2 Score 0 0 0 0 0  Altered sleeping 0   0   Tired, decreased energy 0  0   Change in appetite 0   0   Feeling bad or failure about yourself  0   0   Trouble concentrating 0   0   Moving slowly or fidgety/restless 0   0   Suicidal thoughts 0   0   PHQ-9 Score 0   0   Difficult doing work/chores Not difficult at all   Not difficult at all        03/25/2023    9:29 AM 10/15/2022    9:20 AM 02/07/2021    9:32 AM 02/08/2020    9:04 AM  GAD 7 : Generalized Anxiety Score  Nervous, Anxious, on Edge 0 0 0 0  Control/stop worrying 0 0 0 0  Worry too much - different things 0 0 0 0  Trouble relaxing 0 0 0 0  Restless 0 0 0 0  Easily annoyed or irritable 0 0 0 0  Afraid - awful might happen 0 0 0 0  Total GAD 7 Score 0 0 0 0  Anxiety Difficulty Not difficult at all Not difficult at all Not difficult at all Not difficult at all      ASSESSMENT/PLAN:  Annual physical exam Assessment & Plan: Mammogram due.  Ordered by OBGYN PAP due.  Has appointment with OBGYN Recommend regular self breast exams Normotensive Lipid screening normal Colonoscopy up to date.  Due 2032 Vaccines up to date Shingles at 50 yrs PHQ9/GAD screening  negative   Vitamin D deficiency Assessment & Plan: Refill vitamin D 1.25 mg weekly  Orders: -     Vitamin D (Ergocalciferol); Take 1 capsule (50,000 Units total) by mouth every 7 (seven) days.  Dispense: 12 capsule; Refill: 1    PDMP reviewed  Return in about 1 year (around 03/24/2024), or if symptoms worsen or fail to improve, for PCP.  Dana Allan, MD

## 2023-03-25 NOTE — Patient Instructions (Addendum)
It was a pleasure meeting you today. Thank you for allowing me to take part in your health care.  Our goals for today as we discussed include:  Follow up 1 year for annual visit  Continue Vitamin D 1.25 mg weekly for 12 weeks then start Vitamin D 1000 units daily  Can check out [facility] for hormonal therapy information  If you have any questions or concerns, please do not hesitate to call the office at [phone].  I look forward to our next visit and until then take care and stay safe.  Regards,   Dana Allan, MD   [facility]

## 2023-04-10 ENCOUNTER — Encounter: Payer: Self-pay | Admitting: Family Medicine

## 2023-04-10 NOTE — Assessment & Plan Note (Signed)
Refill vitamin D 1.25 mg weekly

## 2023-04-10 NOTE — Assessment & Plan Note (Signed)
Mammogram due.  Ordered by OBGYN PAP due.  Has appointment with OBGYN Recommend regular self breast exams Normotensive Lipid screening normal Colonoscopy up to date.  Due 2032 Vaccines up to date Shingles at 49 yrs PHQ9/GAD screening negative

## 2023-04-21 ENCOUNTER — Encounter: Payer: Self-pay | Admitting: Family Medicine

## 2023-04-21 DIAGNOSIS — R2231 Localized swelling, mass and lump, right upper limb: Secondary | ICD-10-CM

## 2023-04-22 ENCOUNTER — Encounter: Payer: Self-pay | Admitting: Family Medicine

## 2023-04-22 NOTE — Telephone Encounter (Signed)
Diagnostic mammogram ordered.  

## 2023-04-23 NOTE — Telephone Encounter (Signed)
Hi Bri, I see that Dr. Denyse Amass placed an order for a mammogram, diagnostic, unilateral. The patient requested a bilateral exam. Just FYI.

## 2023-06-08 ENCOUNTER — Other Ambulatory Visit: Payer: Self-pay | Admitting: Nephrology

## 2023-06-08 DIAGNOSIS — N200 Calculus of kidney: Secondary | ICD-10-CM

## 2023-06-08 DIAGNOSIS — E559 Vitamin D deficiency, unspecified: Secondary | ICD-10-CM

## 2023-06-16 ENCOUNTER — Ambulatory Visit
Admission: RE | Admit: 2023-06-16 | Discharge: 2023-06-16 | Disposition: A | Discharge status: Home / Self Care | Payer: 59 | Source: Ambulatory Visit | Attending: Nephrology | Admitting: Nephrology

## 2023-06-16 DIAGNOSIS — E559 Vitamin D deficiency, unspecified: Secondary | ICD-10-CM

## 2023-06-16 DIAGNOSIS — N200 Calculus of kidney: Secondary | ICD-10-CM

## 2023-06-24 ENCOUNTER — Encounter: Payer: 59 | Admitting: Family Medicine

## 2023-08-27 ENCOUNTER — Encounter: Payer: Self-pay | Admitting: Family Medicine

## 2023-08-27 ENCOUNTER — Other Ambulatory Visit: Payer: Self-pay

## 2023-08-27 DIAGNOSIS — R2231 Localized swelling, mass and lump, right upper limb: Secondary | ICD-10-CM

## 2023-09-05 ENCOUNTER — Other Ambulatory Visit: Payer: Self-pay | Admitting: Family Medicine

## 2023-09-05 DIAGNOSIS — E559 Vitamin D deficiency, unspecified: Secondary | ICD-10-CM

## 2023-09-29 ENCOUNTER — Other Ambulatory Visit: Payer: Self-pay

## 2023-09-29 DIAGNOSIS — R2231 Localized swelling, mass and lump, right upper limb: Secondary | ICD-10-CM

## 2023-10-02 NOTE — Telephone Encounter (Signed)
 Called pt and she stated that she will call today, and send us  a message with the dates.

## 2023-11-17 LAB — HM MAMMOGRAPHY

## 2023-12-08 ENCOUNTER — Encounter: Payer: Self-pay | Admitting: Family Medicine

## 2024-01-12 ENCOUNTER — Ambulatory Visit

## 2024-01-19 ENCOUNTER — Ambulatory Visit (INDEPENDENT_AMBULATORY_CARE_PROVIDER_SITE_OTHER): Admitting: Family Medicine

## 2024-01-19 VITALS — BP 110/72 | HR 62 | Temp 98.0°F | Resp 20 | Ht 64.0 in | Wt 149.0 lb

## 2024-01-19 DIAGNOSIS — R7309 Other abnormal glucose: Secondary | ICD-10-CM

## 2024-01-19 DIAGNOSIS — E559 Vitamin D deficiency, unspecified: Secondary | ICD-10-CM

## 2024-01-19 DIAGNOSIS — E785 Hyperlipidemia, unspecified: Secondary | ICD-10-CM

## 2024-01-19 DIAGNOSIS — E611 Iron deficiency: Secondary | ICD-10-CM

## 2024-01-19 DIAGNOSIS — R0982 Postnasal drip: Secondary | ICD-10-CM | POA: Diagnosis not present

## 2024-01-19 DIAGNOSIS — E538 Deficiency of other specified B group vitamins: Secondary | ICD-10-CM

## 2024-01-19 MED ORDER — AZELASTINE HCL 0.1 % NA SOLN: 2.0000 | Freq: Two times a day (BID) | NASAL | 12 refills | Status: DC

## 2024-01-19 MED ORDER — IPRATROPIUM BROMIDE 0.03 % NA SOLN: 2.0000 | Freq: Two times a day (BID) | NASAL | 12 refills | Status: DC

## 2024-01-19 MED ORDER — DOXYCYCLINE HYCLATE 100 MG PO TABS: 100.0000 mg | ORAL_TABLET | Freq: Two times a day (BID) | ORAL | 0 refills | Status: AC

## 2024-01-19 MED ORDER — LEVOCETIRIZINE DIHYDROCHLORIDE 5 MG PO TABS: 5.0000 mg | ORAL_TABLET | Freq: Every evening | ORAL | 0 refills | Status: DC

## 2024-01-19 NOTE — Patient Instructions (Addendum)
 It was a pleasure meeting you today. Thank you for allowing me to take part in your health care.  Our goals for today as we discussed include:  Start Astelin 2 sprays two times a day Start Ipratropium 2 sprays two times a day Start Xyzal 5 mg at night Netti pot or rinse daily   If any fevers, sinus pressure while on holiday start Doxycycline  100 mg two times a day for 5 days Take probiotics daily while on antibiotics  You can take Tylenol and/or Ibuprofen as needed for fever reduction and pain relief.   For cough: honey 1/2 to 1 teaspoon (you can dilute the honey in water or another fluid).  You can also use guaifenesin and dextromethorphan for cough. You can use a humidifier for chest congestion and cough.  If you don't have a humidifier, you can sit in the bathroom with the hot shower running.      For sore throat: try warm salt water gargles, cepacol lozenges, throat spray, warm tea or water with lemon/honey, popsicles or ice, or OTC cold relief medicine for throat discomfort.   For congestion: take a daily anti-histamine like Zyrtec, Claritin, and a oral decongestant, such as pseudoephedrine.  You can also use Flonase 1-2 sprays in each nostril daily.   It is important to stay hydrated: drink plenty of fluids (water, gatorade/powerade/pedialyte, juices, or teas) to keep your throat moisturized and help further relieve irritation/discomfort.    Schedule lab appointment.  Fast for 10 hours   Notify MD about recommendations from Dermatology and if needing surgical evaluation   This is a list of the screening recommended for you and due dates:  Health Maintenance  Topic Date Due   COVID-19 Vaccine (6 - 2024-25 season) 05/12/2023   Flu Shot  03/25/2024   Pap with HPV screening  02/07/2026   DTaP/Tdap/Td vaccine (4 - Td or Tdap) 02/08/2031   Colon Cancer Screening  07/27/2031   Hepatitis C Screening  Completed   HPV Vaccine  Aged Out   Meningitis B Vaccine  Aged Out   HIV  Screening  Discontinued      If you have any questions or concerns, please do not hesitate to call the office at [phone].  I look forward to our next visit and until then take care and stay safe.  Regards,   Valli Gaw, MD   [facility]

## 2024-01-24 ENCOUNTER — Encounter: Payer: Self-pay | Admitting: Family Medicine

## 2024-01-24 DIAGNOSIS — R7309 Other abnormal glucose: Secondary | ICD-10-CM | POA: Insufficient documentation

## 2024-01-24 DIAGNOSIS — R0982 Postnasal drip: Secondary | ICD-10-CM | POA: Insufficient documentation

## 2024-01-24 NOTE — Assessment & Plan Note (Signed)
 Persistent postnasal congestion with left ear congestion, mild cough, and rhinorrhea x 1 week.  No fever or ear infection. Travel to [place] next week. - Prescribed Astelin  nasal spray, 2 sprays morning and evening. - Considered Xyzal  for antihistamine effects, noted potential drowsiness. - Prescribed ipratropium nasal spray for rhinorrhea, up to three times daily as needed. - Prescribed doxycycline  prophylactically for sinus pressure, fevers, or ear pain during travel. - Probiotics daily if antibiotics indicated

## 2024-01-24 NOTE — Progress Notes (Signed)
 SUBJECTIVE:   Chief Complaint  Patient presents with   Nasal Congestion    Went to [facility] 5/19 and tested - for covid, strep and was negative.   Ear Fullness   HPI Presents for acute visit  Discussed the use of AI scribe software for clinical note transcription with the patient, who gave verbal consent to proceed.  History of Present Illness Erika [name] is a 48 year old female who presents with persistent congestion and ear blockage.  She has been experiencing congestion for approximately one and a half to two weeks, with symptoms beginning around Jan 09, 2024. She initially sought care at an urgent care facility on Jan 11, 2024, when the congestion was most severe. Since then, most symptoms have improved except for persistent congestion.  She describes ongoing congestion causing her ears to feel blocked, particularly the left ear, which she cannot clear. There is a sensation of fluid in the ears, a slight decrease in hearing due to the blockage, but no fever, ear drainage, or tinnitus. She has been using Nyquil to aid sleep and a Sudafed-containing syrup prescribed by urgent care to manage symptoms.  She mentions a mild cough that has mostly resolved and significant drainage, but no chest congestion. She experiences nasal congestion and a runny nose with clear drainage. She is concerned about her upcoming flight out of the country on Saturday and seeks advice on managing her symptoms during travel.  She recalls a possible past reaction to Flonase, which may have caused a rash, but she is uncertain if it was directly related to the medication. She has a known allergy to penicillin.     PERTINENT PMH / PSH: As above  OBJECTIVE:  BP 110/72   Pulse 62   Temp 98 F (36.7 C)   Resp 20   Ht 5\' 4"  (1.626 m)   Wt 149 lb (67.6 kg)   SpO2 99%   BMI 25.58 kg/m    Physical Exam Vitals reviewed.  Constitutional:      General: She is not in acute distress.    Appearance: Normal  appearance. She is normal weight. She is not ill-appearing, toxic-appearing or diaphoretic.  HENT:     Right Ear: Tympanic membrane, ear canal and external ear normal.     Left Ear: Tympanic membrane, ear canal and external ear normal.  Eyes:     General:        Right eye: No discharge.        Left eye: No discharge.     Conjunctiva/sclera: Conjunctivae normal.  Neck:     Thyroid: No thyromegaly or thyroid tenderness.  Cardiovascular:     Rate and Rhythm: Normal rate and regular rhythm.     Heart sounds: Normal heart sounds.  Pulmonary:     Effort: Pulmonary effort is normal.     Breath sounds: Normal breath sounds.  Abdominal:     General: Bowel sounds are normal.  Musculoskeletal:        General: Normal range of motion.  Lymphadenopathy:     Cervical: Cervical adenopathy (single painful left lymph node) present.  Skin:    General: Skin is warm and dry.  Neurological:     General: No focal deficit present.     Mental Status: She is alert and oriented to person, place, and time. Mental status is at baseline.  Psychiatric:        Mood and Affect: Mood normal.        Behavior: Behavior  normal.        Thought Content: Thought content normal.        Judgment: Judgment normal.           01/19/2024    3:01 PM 03/25/2023    9:29 AM 10/15/2022    9:20 AM 03/20/2022    9:35 AM 02/07/2021    9:31 AM  Depression screen PHQ 2/9  Decreased Interest 0 0 0 0 0  Down, Depressed, Hopeless 0 0 0 0 0  PHQ - 2 Score 0 0 0 0 0  Altered sleeping 0 0   0  Tired, decreased energy 0 0   0  Change in appetite 0 0   0  Feeling bad or failure about yourself  0 0   0  Trouble concentrating 0 0   0  Moving slowly or fidgety/restless 0 0   0  Suicidal thoughts 0 0   0  PHQ-9 Score 0 0   0  Difficult doing work/chores Not difficult at all Not difficult at all   Not difficult at all      01/19/2024    3:01 PM 03/25/2023    9:29 AM 10/15/2022    9:20 AM 02/07/2021    9:32 AM  GAD 7 :  Generalized Anxiety Score  Nervous, Anxious, on Edge 0 0 0 0  Control/stop worrying 0 0 0 0  Worry too much - different things 0 0 0 0  Trouble relaxing 0 0 0 0  Restless 0 0 0 0  Easily annoyed or irritable 0 0 0 0  Afraid - awful might happen 0 0 0 0  Total GAD 7 Score 0 0 0 0  Anxiety Difficulty Not difficult at all Not difficult at all Not difficult at all Not difficult at all    ASSESSMENT/PLAN:  Post-nasal drip Assessment & Plan: Persistent postnasal congestion with left ear congestion, mild cough, and rhinorrhea x 1 week.  No fever or ear infection. Travel to [place] next week. - Prescribed Astelin  nasal spray, 2 sprays morning and evening. - Considered Xyzal  for antihistamine effects, noted potential drowsiness. - Prescribed ipratropium nasal spray for rhinorrhea, up to three times daily as needed. - Prescribed doxycycline  prophylactically for sinus pressure, fevers, or ear pain during travel. - Probiotics daily if antibiotics indicated  Orders: -     Azelastine  HCl; Place 2 sprays into both nostrils 2 (two) times daily. Use in each nostril as directed  Dispense: 30 mL; Refill: 12 -     Ipratropium Bromide ; Place 2 sprays into both nostrils every 12 (twelve) hours.  Dispense: 30 mL; Refill: 12 -     Levocetirizine Dihydrochloride ; Take 1 tablet (5 mg total) by mouth every evening.  Dispense: 30 tablet; Refill: 0  Vitamin D  deficiency -     VITAMIN D  25 Hydroxy (Vit-D Deficiency, Fractures); Future  Hyperlipidemia, unspecified hyperlipidemia type -     Lipid panel; Future -     Comprehensive metabolic panel with GFR; Future  Iron deficiency -     CBC with Differential/Platelet; Future  B12 deficiency -     Vitamin B12; Future  Abnormal glucose -     Hemoglobin A1c; Future  Other orders -     Doxycycline  Hyclate; Take 1 tablet (100 mg total) by mouth 2 (two) times daily for 5 days.  Dispense: 10 tablet; Refill: 0   PDMP reviewed  Return if symptoms worsen or fail  to improve, for PCP.  Valli Gaw, MD

## 2024-02-04 ENCOUNTER — Other Ambulatory Visit (INDEPENDENT_AMBULATORY_CARE_PROVIDER_SITE_OTHER)

## 2024-02-04 DIAGNOSIS — E785 Hyperlipidemia, unspecified: Secondary | ICD-10-CM

## 2024-02-04 DIAGNOSIS — R7309 Other abnormal glucose: Secondary | ICD-10-CM | POA: Diagnosis not present

## 2024-02-04 DIAGNOSIS — E611 Iron deficiency: Secondary | ICD-10-CM

## 2024-02-04 DIAGNOSIS — E538 Deficiency of other specified B group vitamins: Secondary | ICD-10-CM

## 2024-02-04 DIAGNOSIS — E559 Vitamin D deficiency, unspecified: Secondary | ICD-10-CM

## 2024-02-04 NOTE — Addendum Note (Signed)
 Addended by: Lindle Rhea on: 02/04/2024 08:43 AM   Modules accepted: Orders

## 2024-02-05 ENCOUNTER — Ambulatory Visit: Payer: Self-pay | Admitting: Family Medicine

## 2024-02-05 LAB — COMPREHENSIVE METABOLIC PANEL WITH GFR
ALT: 24 IU/L (ref 0–32)
AST: 23 IU/L (ref 0–40)
Albumin: 4 g/dL (ref 3.9–4.9)
Alkaline Phosphatase: 89 IU/L (ref 44–121)
BUN/Creatinine Ratio: 21 (ref 9–23)
BUN: 20 mg/dL (ref 6–24)
Bilirubin Total: 0.5 mg/dL (ref 0.0–1.2)
CO2: 23 mmol/L (ref 20–29)
Calcium: 9.2 mg/dL (ref 8.7–10.2)
Chloride: 103 mmol/L (ref 96–106)
Creatinine, Ser: 0.96 mg/dL (ref 0.57–1.00)
Globulin, Total: 2.2 g/dL (ref 1.5–4.5)
Glucose: 82 mg/dL (ref 70–99)
Potassium: 4.9 mmol/L (ref 3.5–5.2)
Sodium: 140 mmol/L (ref 134–144)
Total Protein: 6.2 g/dL (ref 6.0–8.5)
eGFR: 73 mL/min/{1.73_m2} (ref >59)

## 2024-02-05 LAB — CBC WITH DIFFERENTIAL/PLATELET
Basophils Absolute: 0.1 10*3/uL (ref 0.0–0.2)
Basos: 1 %
EOS (ABSOLUTE): 0.1 10*3/uL (ref 0.0–0.4)
Eos: 2 %
Hematocrit: 39.6 % (ref 34.0–46.6)
Hemoglobin: 12.9 g/dL (ref 11.1–15.9)
Immature Grans (Abs): 0 10*3/uL (ref 0.0–0.1)
Immature Granulocytes: 0 %
Lymphocytes Absolute: 2.4 10*3/uL (ref 0.7–3.1)
Lymphs: 33 %
MCH: 30.7 pg (ref 26.6–33.0)
MCHC: 32.6 g/dL (ref 31.5–35.7)
MCV: 94 fL (ref 79–97)
Monocytes Absolute: 0.7 10*3/uL (ref 0.1–0.9)
Monocytes: 10 %
Neutrophils Absolute: 3.9 10*3/uL (ref 1.4–7.0)
Neutrophils: 54 %
Platelets: 333 10*3/uL (ref 150–450)
RBC: 4.2 x10E6/uL (ref 3.77–5.28)
RDW: 13.6 % (ref 11.7–15.4)
WBC: 7.1 10*3/uL (ref 3.4–10.8)

## 2024-02-05 LAB — VITAMIN D 25 HYDROXY (VIT D DEFICIENCY, FRACTURES): Vit D, 25-Hydroxy: 30.8 ng/mL (ref 30.0–100.0)

## 2024-02-05 LAB — LIPID PANEL
Chol/HDL Ratio: 3.3 ratio (ref 0.0–4.4)
Cholesterol, Total: 199 mg/dL (ref 100–199)
HDL: 60 mg/dL (ref >39)
LDL Chol Calc (NIH): 126 mg/dL — ABNORMAL HIGH (ref 0–99)
Triglycerides: 71 mg/dL (ref 0–149)
VLDL Cholesterol Cal: 13 mg/dL (ref 5–40)

## 2024-02-05 LAB — HEMOGLOBIN A1C
Est. average glucose Bld gHb Est-mCnc: 114 mg/dL
Hgb A1c MFr Bld: 5.6 % (ref 4.8–5.6)

## 2024-02-05 LAB — VITAMIN B12: Vitamin B-12: 485 pg/mL (ref 232–1245)

## 2024-03-28 ENCOUNTER — Encounter: Payer: 59 | Admitting: Family Medicine

## 2024-03-31 ENCOUNTER — Ambulatory Visit

## 2024-03-31 VITALS — BP 110/70 | HR 59 | Temp 97.5°F | Ht 64.0 in | Wt 147.4 lb

## 2024-03-31 DIAGNOSIS — E782 Mixed hyperlipidemia: Secondary | ICD-10-CM | POA: Diagnosis not present

## 2024-03-31 DIAGNOSIS — E559 Vitamin D deficiency, unspecified: Secondary | ICD-10-CM

## 2024-03-31 DIAGNOSIS — R0982 Postnasal drip: Secondary | ICD-10-CM | POA: Diagnosis not present

## 2024-03-31 DIAGNOSIS — E538 Deficiency of other specified B group vitamins: Secondary | ICD-10-CM

## 2024-03-31 DIAGNOSIS — Z780 Asymptomatic menopausal state: Secondary | ICD-10-CM | POA: Diagnosis not present

## 2024-03-31 DIAGNOSIS — Z131 Encounter for screening for diabetes mellitus: Secondary | ICD-10-CM

## 2024-03-31 NOTE — Assessment & Plan Note (Signed)
 Levels normalized on 02/04/24, continue OTC vitamin D  800 international units .

## 2024-03-31 NOTE — Assessment & Plan Note (Signed)
 Resolved with Xyzal  5 mg daily. Currently not on medication, will continue monitoring for symptoms and if reoccurs plans on taking Xyzal  5 mg daily prn.

## 2024-03-31 NOTE — Progress Notes (Signed)
 Established Patient Office Visit TOC from Dr. Hope (last OV on 01/19/24)   Subjective  Patient ID: Erika [name], female    DOB: 1975/10/10  Age: 48 y.o. MRN: [id number]  Chief Complaint  Patient presents with   Establish Care    She  has a past medical history of Allergy, Annual physical exam (02/01/2019), Bradycardia (02/08/2020), Hives (02/01/2019), Lipid screening (10/18/2022), Perimenopausal (02/01/2019), and Positive ANA (antinuclear antibody) (03/06/2020).  HPI Discussed the use of AI scribe software for clinical note transcription with the patient, who gave verbal consent to proceed.  History of Present Illness Erika [name] is a 48 year old female who presents to establish care.   She recently returned from a vacation during which she contracted COVID-19. Her symptoms were similar to a cold with body aches, which were more severe this time. She has a residual cough that is improving. She confirmed the COVID-19 diagnosis with home testing.  She has a history of postnasal drip, which improved with allergy medication, but she is not currently taking any allergy medications. She experienced a previous reaction to nasal sprays, specifically Flonase, which caused her to break out, and she has since avoided it.  She was reported to have painful cervical lymph node enlargement on the left side during her last visit with Dr. Hope. Patient denies fever, unintentional weight loss, family h/o cancer, difficulty swallowing, lymphadenopathy in other part of her body.   She has a history of low vitamin D , which was treated with high dosage supplements, and her levels have since stabilized. She also sees a nephrologist for monitoring proteinuria, and her kidney function is currently normal.  Her cholesterol shows mildly elevated LDL cholesterol, despite maintaining a good diet and regular exercise, including CrossFit five days a week.   She is established with ob/gyn and is not HRT for  perimenopausal symptoms (currently on Progesterone 100 mg at bedtime and Estradiol patch 0.0375 mg (twice weekly).   Socially, she drinks alcohol occasionally, a couple of times a month, and does not smoke. She works for a [employer] and travels for work, ensuring her vaccinations are up to date for travel purposes.  ROS As per HPI    Objective:     BP 110/70 (BP Location: Right Arm, Patient Position: Sitting, Cuff Size: Small)   Pulse (!) 59   Temp (!) 97.5 F (36.4 C) (Oral)   Ht 5' 4 (1.626 m)   Wt 147 lb 6.4 oz (66.9 kg)   SpO2 99%   BMI 25.30 kg/m      03/31/2024   10:07 AM 01/19/2024    3:01 PM 03/25/2023    9:29 AM  Depression screen PHQ 2/9  Decreased Interest 0 0 0  Down, Depressed, Hopeless 0 0 0  PHQ - 2 Score 0 0 0  Altered sleeping 0 0 0  Tired, decreased energy 0 0 0  Change in appetite 0 0 0  Feeling bad or failure about yourself  0 0 0  Trouble concentrating 0 0 0  Moving slowly or fidgety/restless 0 0 0  Suicidal thoughts 0 0 0  PHQ-9 Score 0 0 0  Difficult doing work/chores Not difficult at all Not difficult at all Not difficult at all      03/31/2024   10:08 AM 01/19/2024    3:01 PM 03/25/2023    9:29 AM 10/15/2022    9:20 AM  GAD 7 : Generalized Anxiety Score  Nervous, Anxious, on Edge 0 0 0 0  Control/stop worrying 0 0 0 0  Worry too much - different things 0 0 0 0  Trouble relaxing 0 0 0 0  Restless 0 0 0 0  Easily annoyed or irritable 0 0 0 0  Afraid - awful might happen 0 0 0 0  Total GAD 7 Score 0 0 0 0  Anxiety Difficulty Not difficult at all Not difficult at all Not difficult at all Not difficult at all      03/31/2024   10:07 AM 01/19/2024    3:01 PM 03/25/2023    9:29 AM  Depression screen PHQ 2/9  Decreased Interest 0 0 0  Down, Depressed, Hopeless 0 0 0  PHQ - 2 Score 0 0 0  Altered sleeping 0 0 0  Tired, decreased energy 0 0 0  Change in appetite 0 0 0  Feeling bad or failure about yourself  0 0 0  Trouble  concentrating 0 0 0  Moving slowly or fidgety/restless 0 0 0  Suicidal thoughts 0 0 0  PHQ-9 Score 0 0 0  Difficult doing work/chores Not difficult at all Not difficult at all Not difficult at all      03/31/2024   10:08 AM 01/19/2024    3:01 PM 03/25/2023    9:29 AM 10/15/2022    9:20 AM  GAD 7 : Generalized Anxiety Score  Nervous, Anxious, on Edge 0 0 0 0  Control/stop worrying 0 0 0 0  Worry too much - different things 0 0 0 0  Trouble relaxing 0 0 0 0  Restless 0 0 0 0  Easily annoyed or irritable 0 0 0 0  Afraid - awful might happen 0 0 0 0  Total GAD 7 Score 0 0 0 0  Anxiety Difficulty Not difficult at all Not difficult at all Not difficult at all Not difficult at all   SDOH Screenings   Food Insecurity: No Food Insecurity (01/31/2024)   Received from [facility]  Housing: Low Risk  (01/31/2024)   Received from [facility]  Transportation Needs: No Transportation Needs (01/31/2024)   Received from [facility]  Utilities: Not At Risk (01/31/2024)   Received from [facility]  Alcohol Screen: Low Risk  (01/19/2024)  Depression (PHQ2-9): Low Risk  (03/31/2024)  Financial Resource Strain: Low Risk  (01/31/2024)   Received from [facility]  Physical Activity: Sufficiently Active (01/19/2024)  Social Connections: Moderately Integrated (01/19/2024)  Stress: No Stress Concern Present (01/19/2024)  Tobacco Use: Low Risk  (03/31/2024)     Physical Exam Constitutional:      General: She is in acute distress.     Appearance: Normal appearance. She is normal weight.  HENT:     Head: Normocephalic and atraumatic.     Right Ear: Tympanic membrane and external ear normal.     Left Ear: Tympanic membrane and external ear normal.     Mouth/Throat:     Mouth: Mucous membranes are moist.     Pharynx: Uvula midline. Posterior oropharyngeal erythema (mild erythema of posterior oropharyngeal mucosa) present. No  oropharyngeal exudate or uvula swelling.     Tonsils: No tonsillar exudate or tonsillar abscesses.  Neck:     Thyroid: No thyroid mass or thyroid tenderness.  Cardiovascular:     Rate and Rhythm: Normal rate and regular rhythm.  Pulmonary:     Effort: Pulmonary effort is normal.     Breath sounds: Normal breath sounds.  Abdominal:     General: Bowel sounds are normal.     Palpations: Abdomen is soft.     Tenderness: There is no guarding.  Musculoskeletal:     Cervical back: Neck supple. No rigidity or tenderness.     Right lower leg: No edema.     Left lower leg: No edema.  Lymphadenopathy:     Cervical: No cervical adenopathy.  Skin:    General: Skin is warm.  Neurological:     Mental Status: She is alert and oriented to person, place, and time.  Psychiatric:        Mood and Affect: Mood normal.        Behavior: Behavior normal.        No results found for any visits on 03/31/24.  The 10-year ASCVD risk score (Arnett DK, et al., 2019) is: 0.7%    Following results were reviewed and discussed with the patient during today's visit:  Component     Latest Ref Rng 02/04/2024  WBC     3.4 - 10.8 x10E3/uL 7.1   RBC     3.77 - 5.28 x10E6/uL 4.20   Hemoglobin     11.1 - 15.9 g/dL 87.0   HCT     65.9 - 53.3 % 39.6   MCV     79 - 97 fL 94   MCH     26.6 - 33.0 pg 30.7   MCHC     31.5 - 35.7 g/dL 67.3   RDW     88.2 - 84.5 % 13.6   Platelets     150 - 450 x10E3/uL 333   Neutrophils     Not Estab. % 54   Lymphs     Not Estab. % 33   Monocytes     Not Estab. % 10   Eos     Not Estab. % 2   Basos     Not Estab. % 1   NEUT#     1.4 - 7.0 x10E3/uL 3.9   Lymphs Abs     0.7 - 3.1 x10E3/uL 2.4   Monocytes Absolute     0.1 - 0.9 x10E3/uL 0.7   EOS (ABSOLUTE)     0.0 - 0.4 x10E3/uL 0.1   Basophils Absolute     0.0 - 0.2 x10E3/uL 0.1   Immature Granulocytes     Not Estab. % 0   Immature Grans (Abs)     0.0 - 0.1 x10E3/uL 0.0   Glucose     70 - 99 mg/dL 82    BUN     6 - 24 mg/dL 20   Creatinine     9.42 - 1.00 mg/dL 9.03   eGFR     >40 fO/fpw/8.26 73   BUN/Creatinine Ratio     9 - 23  21   Sodium     134 - 144 mmol/L 140   Potassium     3.5 - 5.2 mmol/L 4.9   Chloride     96 - 106 mmol/L 103   CO2     20 - 29 mmol/L 23   Calcium     8.7 - 10.2 mg/dL 9.2   Total Protein     6.0 - 8.5 g/dL 6.2   Albumin     3.9 - 4.9 g/dL 4.0   Globulin, Total     1.5 - 4.5 g/dL 2.2   Total Bilirubin     0.0 - 1.2 mg/dL 0.5   Alkaline Phosphatase  44 - 121 IU/L 89   AST     0 - 40 IU/L 23   ALT     0 - 32 IU/L 24   Cholesterol, Total     100 - 199 mg/dL 800   Triglycerides     0 - 149 mg/dL 71   HDL Cholesterol     >39 mg/dL 60   VLDL Cholesterol Cal     5 - 40 mg/dL 13   LDL Chol Calc (NIH)     0 - 99 mg/dL 873 (H)   Total CHOL/HDL Ratio     0.0 - 4.4 ratio 3.3   Hemoglobin A1C     4.8 - 5.6 % 5.6   Est. average glucose Bld gHb Est-mCnc     mg/dL 885   Vitamin B12     232 - 1,245 pg/mL 485   Vitamin D , 25-Hydroxy     30.0 - 100.0 ng/mL 30.8       Assessment & Plan:  - Left cervical lymph node enlargement history: Enlarged cervical lymph node on left side during last OV. Not able to appreciate this during today's visit.  Normal CBC, no lymphoma family history. Monitor for changes in size or symptoms. Re-evaluate if new symptoms such as difficulty swallowing, neck fullness, or additional lymph node enlargement occur.  Mixed hyperlipidemia Assessment & Plan: Fasting lipid panel from 02/04/24 with elevated LDL cholesterol at 126. The 10-year ASCVD risk score (Arnett DK, et al., 2019) is: 0.7%. No pharmacological intervention recommended.  Continue balanced diet, regular exercise, will monitor lipid panel annually.    Orders: -     Lipid panel; Future -     Comprehensive metabolic panel with GFR; Future  Post-nasal drip Assessment & Plan: Resolved with Xyzal  5 mg daily. Currently not on medication, will continue  monitoring for symptoms and if reoccurs plans on taking Xyzal  5 mg daily prn.    B12 deficiency Assessment & Plan: B12 normalized on lab from 02/04/24  Orders: -     Vitamin B12; Future  Menopause Assessment & Plan: LMP at age 57. Follows with OB/GYN who has been managing HRT treatment. Plans on updating mammogram through them as well. Last mammogram normal on 10/2023.    Vitamin D  deficiency Assessment & Plan: Levels normalized on 02/04/24, continue OTC vitamin D  800 international units .   Orders: -     VITAMIN D  25 Hydroxy (Vit-D Deficiency, Fractures); Future  Encounter for screening examination for intermediate hyperglycemia and diabetes mellitus -     Hemoglobin A1c; Future   I personally spent a total of 40 minutes in the care of the patient today including preparing to see the patient, performing a medically appropriate exam/evaluation, placing orders, referring and communicating with other health care professionals, documenting clinical information in the EHR, and independently interpreting results.  Return in about 1 year (around 03/31/2025) for annual plus chronic follow up, fasting labs couple of days before appointment .   Luke Shade, MD

## 2024-03-31 NOTE — Assessment & Plan Note (Signed)
 LMP at age 48. Follows with OB/GYN who has been managing HRT treatment. Plans on updating mammogram through them as well. Last mammogram normal on 10/2023.

## 2024-03-31 NOTE — Assessment & Plan Note (Signed)
 Fasting lipid panel from 02/04/24 with elevated LDL cholesterol at 126. The 10-year ASCVD risk score (Arnett DK, et al., 2019) is: 0.7%. No pharmacological intervention recommended.  Continue balanced diet, regular exercise, will monitor lipid panel annually.

## 2024-03-31 NOTE — Assessment & Plan Note (Signed)
 B12 normalized on lab from 02/04/24

## 2024-06-10 ENCOUNTER — Other Ambulatory Visit: Payer: Self-pay | Admitting: Nephrology

## 2024-06-10 DIAGNOSIS — R809 Proteinuria, unspecified: Secondary | ICD-10-CM

## 2024-07-14 ENCOUNTER — Other Ambulatory Visit

## 2025-04-04 ENCOUNTER — Other Ambulatory Visit

## 2025-04-06 ENCOUNTER — Encounter
